# Patient Record
Sex: Female | Born: 1955 | Race: White | Hispanic: No | Marital: Married | Smoking: Current every day smoker
Health system: Southern US, Community
[De-identification: ages and names within clinical notes are randomized; demographics above are authoritative.]

## PROBLEM LIST (undated history)

## (undated) DIAGNOSIS — G609 Hereditary and idiopathic neuropathy, unspecified: Secondary | ICD-10-CM

## (undated) DIAGNOSIS — G43909 Migraine, unspecified, not intractable, without status migrainosus: Secondary | ICD-10-CM

## (undated) DIAGNOSIS — R609 Edema, unspecified: Secondary | ICD-10-CM

## (undated) DIAGNOSIS — I10 Essential (primary) hypertension: Secondary | ICD-10-CM

## (undated) DIAGNOSIS — R32 Unspecified urinary incontinence: Secondary | ICD-10-CM

## (undated) DIAGNOSIS — N390 Urinary tract infection, site not specified: Secondary | ICD-10-CM

## (undated) DIAGNOSIS — G4730 Sleep apnea, unspecified: Secondary | ICD-10-CM

## (undated) DIAGNOSIS — F329 Major depressive disorder, single episode, unspecified: Secondary | ICD-10-CM

## (undated) DIAGNOSIS — IMO0001 Reserved for inherently not codable concepts without codable children: Secondary | ICD-10-CM

## (undated) DIAGNOSIS — K279 Peptic ulcer, site unspecified, unspecified as acute or chronic, without hemorrhage or perforation: Secondary | ICD-10-CM

## (undated) DIAGNOSIS — L909 Atrophic disorder of skin, unspecified: Secondary | ICD-10-CM

## (undated) DIAGNOSIS — K7460 Unspecified cirrhosis of liver: Secondary | ICD-10-CM

## (undated) DIAGNOSIS — G2581 Restless legs syndrome: Secondary | ICD-10-CM

## (undated) DIAGNOSIS — E785 Hyperlipidemia, unspecified: Secondary | ICD-10-CM

## (undated) DIAGNOSIS — B354 Tinea corporis: Secondary | ICD-10-CM

## (undated) DIAGNOSIS — M5137 Other intervertebral disc degeneration, lumbosacral region: Secondary | ICD-10-CM

## (undated) DIAGNOSIS — L919 Hypertrophic disorder of the skin, unspecified: Secondary | ICD-10-CM

## (undated) DIAGNOSIS — J40 Bronchitis, not specified as acute or chronic: Secondary | ICD-10-CM

## (undated) DIAGNOSIS — IMO0002 Reserved for concepts with insufficient information to code with codable children: Secondary | ICD-10-CM

## (undated) DIAGNOSIS — G894 Chronic pain syndrome: Secondary | ICD-10-CM

## (undated) DIAGNOSIS — F3289 Other specified depressive episodes: Secondary | ICD-10-CM

## (undated) DIAGNOSIS — I671 Cerebral aneurysm, nonruptured: Secondary | ICD-10-CM

## (undated) DIAGNOSIS — G4700 Insomnia, unspecified: Secondary | ICD-10-CM

## (undated) DIAGNOSIS — M1990 Unspecified osteoarthritis, unspecified site: Secondary | ICD-10-CM

## (undated) DIAGNOSIS — G5600 Carpal tunnel syndrome, unspecified upper limb: Secondary | ICD-10-CM

## (undated) DIAGNOSIS — F17200 Nicotine dependence, unspecified, uncomplicated: Secondary | ICD-10-CM

## (undated) DIAGNOSIS — J309 Allergic rhinitis, unspecified: Secondary | ICD-10-CM

## (undated) DIAGNOSIS — M51379 Other intervertebral disc degeneration, lumbosacral region without mention of lumbar back pain or lower extremity pain: Secondary | ICD-10-CM

## (undated) DIAGNOSIS — E119 Type 2 diabetes mellitus without complications: Secondary | ICD-10-CM

## (undated) DIAGNOSIS — M129 Arthropathy, unspecified: Secondary | ICD-10-CM

## (undated) DIAGNOSIS — E669 Obesity, unspecified: Secondary | ICD-10-CM

## (undated) DIAGNOSIS — J45909 Unspecified asthma, uncomplicated: Secondary | ICD-10-CM

## (undated) DIAGNOSIS — K219 Gastro-esophageal reflux disease without esophagitis: Secondary | ICD-10-CM

## (undated) DIAGNOSIS — D649 Anemia, unspecified: Secondary | ICD-10-CM

## (undated) DIAGNOSIS — K635 Polyp of colon: Secondary | ICD-10-CM

## (undated) DIAGNOSIS — F411 Generalized anxiety disorder: Secondary | ICD-10-CM

## (undated) HISTORY — DX: Anemia, unspecified: D64.9

## (undated) HISTORY — DX: Nicotine dependence, unspecified, uncomplicated: F17.200

## (undated) HISTORY — DX: Chronic pain syndrome: G89.4

## (undated) HISTORY — DX: Polyp of colon: K63.5

## (undated) HISTORY — DX: Other specified depressive episodes: F32.89

## (undated) HISTORY — DX: Peptic ulcer, site unspecified, unspecified as acute or chronic, without hemorrhage or perforation: K27.9

## (undated) HISTORY — DX: Insomnia, unspecified: G47.00

## (undated) HISTORY — DX: Edema, unspecified: R60.9

## (undated) HISTORY — PX: TONSILLECTOMY: SUR1361

## (undated) HISTORY — DX: Essential (primary) hypertension: I10

## (undated) HISTORY — DX: Atrophic disorder of skin, unspecified: L90.9

## (undated) HISTORY — DX: Unspecified osteoarthritis, unspecified site: M19.90

## (undated) HISTORY — DX: Cerebral aneurysm, nonruptured: I67.1

## (undated) HISTORY — DX: Urinary tract infection, site not specified: N39.0

## (undated) HISTORY — DX: Bronchitis, not specified as acute or chronic: J40

## (undated) HISTORY — DX: Carpal tunnel syndrome, unspecified upper limb: G56.00

## (undated) HISTORY — DX: Sleep apnea, unspecified: G47.30

## (undated) HISTORY — DX: Type 2 diabetes mellitus without complications: E11.9

## (undated) HISTORY — DX: Unspecified urinary incontinence: R32

## (undated) HISTORY — DX: Obesity, unspecified: E66.9

## (undated) HISTORY — PX: CLAVICLE SURGERY: SHX598

## (undated) HISTORY — PX: PARTIAL HYSTERECTOMY: SHX80

## (undated) HISTORY — DX: Unspecified cirrhosis of liver: K74.60

## (undated) HISTORY — DX: Major depressive disorder, single episode, unspecified: F32.9

## (undated) HISTORY — DX: Other intervertebral disc degeneration, lumbosacral region: M51.37

## (undated) HISTORY — PX: ABDOMINAL HYSTERECTOMY: SHX81

## (undated) HISTORY — DX: Restless legs syndrome: G25.81

## (undated) HISTORY — DX: Migraine, unspecified, not intractable, without status migrainosus: G43.909

## (undated) HISTORY — PX: TOTAL KNEE ARTHROPLASTY: SHX125

## (undated) HISTORY — DX: Unspecified asthma, uncomplicated: J45.909

## (undated) HISTORY — DX: Arthropathy, unspecified: M12.9

## (undated) HISTORY — DX: Hereditary and idiopathic neuropathy, unspecified: G60.9

## (undated) HISTORY — DX: Gastro-esophageal reflux disease without esophagitis: K21.9

## (undated) HISTORY — DX: Tinea corporis: B35.4

## (undated) HISTORY — PX: CARPAL TUNNEL RELEASE: SHX101

## (undated) HISTORY — PX: COLONOSCOPY: SHX174

## (undated) HISTORY — DX: Reserved for concepts with insufficient information to code with codable children: IMO0002

## (undated) HISTORY — PX: BRAIN SURGERY: SHX531

## (undated) HISTORY — DX: Other intervertebral disc degeneration, lumbosacral region without mention of lumbar back pain or lower extremity pain: M51.379

## (undated) HISTORY — DX: Generalized anxiety disorder: F41.1

## (undated) HISTORY — DX: Reserved for inherently not codable concepts without codable children: IMO0001

## (undated) HISTORY — DX: Allergic rhinitis, unspecified: J30.9

## (undated) HISTORY — DX: Hyperlipidemia, unspecified: E78.5

## (undated) HISTORY — DX: Hypertrophic disorder of the skin, unspecified: L91.9

---

## 1982-06-10 HISTORY — PX: OTHER SURGICAL HISTORY: SHX169

## 1991-06-11 HISTORY — PX: OTHER SURGICAL HISTORY: SHX169

## 2003-06-11 ENCOUNTER — Encounter (INDEPENDENT_AMBULATORY_CARE_PROVIDER_SITE_OTHER): Payer: Self-pay | Admitting: Family Medicine

## 2005-10-17 ENCOUNTER — Ambulatory Visit: Payer: Self-pay | Admitting: Family Medicine

## 2005-11-05 ENCOUNTER — Ambulatory Visit: Payer: Self-pay | Admitting: Family Medicine

## 2005-11-14 ENCOUNTER — Ambulatory Visit: Payer: Self-pay | Admitting: Family Medicine

## 2005-12-04 ENCOUNTER — Encounter (INDEPENDENT_AMBULATORY_CARE_PROVIDER_SITE_OTHER): Payer: Self-pay | Admitting: Family Medicine

## 2005-12-12 ENCOUNTER — Ambulatory Visit: Payer: Self-pay | Admitting: Family Medicine

## 2005-12-17 ENCOUNTER — Ambulatory Visit: Payer: Self-pay | Admitting: Family Medicine

## 2005-12-18 ENCOUNTER — Encounter (INDEPENDENT_AMBULATORY_CARE_PROVIDER_SITE_OTHER): Payer: Self-pay | Admitting: Family Medicine

## 2005-12-26 ENCOUNTER — Ambulatory Visit: Payer: Self-pay | Admitting: Family Medicine

## 2006-01-10 ENCOUNTER — Emergency Department (HOSPITAL_COMMUNITY): Admission: EM | Admit: 2006-01-10 | Discharge: 2006-01-10 | Payer: Self-pay | Admitting: Emergency Medicine

## 2006-01-13 ENCOUNTER — Ambulatory Visit: Payer: Self-pay | Admitting: Family Medicine

## 2006-01-17 ENCOUNTER — Ambulatory Visit: Payer: Self-pay | Admitting: Family Medicine

## 2006-01-21 ENCOUNTER — Ambulatory Visit (HOSPITAL_COMMUNITY): Payer: Self-pay | Admitting: Psychology

## 2006-01-31 ENCOUNTER — Ambulatory Visit: Payer: Self-pay | Admitting: Family Medicine

## 2006-02-04 ENCOUNTER — Ambulatory Visit (HOSPITAL_COMMUNITY): Payer: Self-pay | Admitting: Psychology

## 2006-02-04 ENCOUNTER — Encounter (INDEPENDENT_AMBULATORY_CARE_PROVIDER_SITE_OTHER): Payer: Self-pay | Admitting: Family Medicine

## 2006-02-04 LAB — CONVERTED CEMR LAB: WBC, blood: 7.2 10*3/uL

## 2006-02-13 ENCOUNTER — Ambulatory Visit: Payer: Self-pay | Admitting: Family Medicine

## 2006-02-17 ENCOUNTER — Ambulatory Visit: Payer: Self-pay | Admitting: Family Medicine

## 2006-02-18 ENCOUNTER — Encounter (INDEPENDENT_AMBULATORY_CARE_PROVIDER_SITE_OTHER): Payer: Self-pay | Admitting: Family Medicine

## 2006-02-18 LAB — CONVERTED CEMR LAB
RBC count: 4.55 10*6/uL
WBC, blood: 6.3 10*3/uL

## 2006-02-28 ENCOUNTER — Ambulatory Visit: Payer: Self-pay | Admitting: Family Medicine

## 2006-03-08 ENCOUNTER — Encounter (INDEPENDENT_AMBULATORY_CARE_PROVIDER_SITE_OTHER): Payer: Self-pay | Admitting: Family Medicine

## 2006-03-28 ENCOUNTER — Ambulatory Visit: Payer: Self-pay | Admitting: Family Medicine

## 2006-05-08 ENCOUNTER — Ambulatory Visit: Payer: Self-pay | Admitting: Family Medicine

## 2006-05-13 ENCOUNTER — Encounter: Payer: Self-pay | Admitting: Family Medicine

## 2006-05-13 DIAGNOSIS — K219 Gastro-esophageal reflux disease without esophagitis: Secondary | ICD-10-CM

## 2006-05-13 DIAGNOSIS — E785 Hyperlipidemia, unspecified: Secondary | ICD-10-CM | POA: Insufficient documentation

## 2006-05-13 DIAGNOSIS — J309 Allergic rhinitis, unspecified: Secondary | ICD-10-CM | POA: Insufficient documentation

## 2006-05-13 DIAGNOSIS — K279 Peptic ulcer, site unspecified, unspecified as acute or chronic, without hemorrhage or perforation: Secondary | ICD-10-CM | POA: Insufficient documentation

## 2006-05-13 DIAGNOSIS — F329 Major depressive disorder, single episode, unspecified: Secondary | ICD-10-CM | POA: Insufficient documentation

## 2006-05-13 DIAGNOSIS — N390 Urinary tract infection, site not specified: Secondary | ICD-10-CM

## 2006-05-13 DIAGNOSIS — G609 Hereditary and idiopathic neuropathy, unspecified: Secondary | ICD-10-CM | POA: Insufficient documentation

## 2006-05-13 DIAGNOSIS — J45909 Unspecified asthma, uncomplicated: Secondary | ICD-10-CM | POA: Insufficient documentation

## 2006-05-13 DIAGNOSIS — G2581 Restless legs syndrome: Secondary | ICD-10-CM | POA: Insufficient documentation

## 2006-05-13 DIAGNOSIS — R32 Unspecified urinary incontinence: Secondary | ICD-10-CM | POA: Insufficient documentation

## 2006-05-13 DIAGNOSIS — I10 Essential (primary) hypertension: Secondary | ICD-10-CM | POA: Insufficient documentation

## 2006-05-13 DIAGNOSIS — G43909 Migraine, unspecified, not intractable, without status migrainosus: Secondary | ICD-10-CM | POA: Insufficient documentation

## 2006-05-13 DIAGNOSIS — F411 Generalized anxiety disorder: Secondary | ICD-10-CM | POA: Insufficient documentation

## 2006-05-13 DIAGNOSIS — M1990 Unspecified osteoarthritis, unspecified site: Secondary | ICD-10-CM | POA: Insufficient documentation

## 2006-05-13 DIAGNOSIS — I671 Cerebral aneurysm, nonruptured: Secondary | ICD-10-CM

## 2006-05-13 DIAGNOSIS — IMO0001 Reserved for inherently not codable concepts without codable children: Secondary | ICD-10-CM | POA: Insufficient documentation

## 2006-06-05 ENCOUNTER — Encounter: Payer: Self-pay | Admitting: Internal Medicine

## 2006-06-05 ENCOUNTER — Other Ambulatory Visit: Admission: RE | Admit: 2006-06-05 | Discharge: 2006-06-05 | Payer: Self-pay | Admitting: Family Medicine

## 2006-06-05 ENCOUNTER — Encounter (INDEPENDENT_AMBULATORY_CARE_PROVIDER_SITE_OTHER): Payer: Self-pay | Admitting: Family Medicine

## 2006-06-05 ENCOUNTER — Ambulatory Visit: Payer: Self-pay | Admitting: Family Medicine

## 2006-06-10 ENCOUNTER — Encounter (INDEPENDENT_AMBULATORY_CARE_PROVIDER_SITE_OTHER): Payer: Self-pay | Admitting: Family Medicine

## 2006-06-10 HISTORY — PX: OTHER SURGICAL HISTORY: SHX169

## 2006-06-13 ENCOUNTER — Ambulatory Visit: Payer: Self-pay | Admitting: Family Medicine

## 2006-06-13 LAB — CONVERTED CEMR LAB
ALT: 20 units/L (ref 0–35)
Alkaline Phosphatase: 93 units/L (ref 39–117)
Basophils Absolute: 0.1 10*3/uL (ref 0.0–0.1)
Basophils Relative: 1 % (ref 0–1)
CO2: 19 meq/L (ref 19–32)
Cholesterol: 223 mg/dL — ABNORMAL HIGH (ref 0–200)
Creatinine, Ser: 0.7 mg/dL (ref 0.40–1.20)
Eosinophils Relative: 5 % (ref 0–5)
HCT: 43.3 % (ref 36.0–46.0)
Hemoglobin: 13.9 g/dL (ref 12.0–15.0)
Hepatitis B Surface Ag: NEGATIVE
LDL Cholesterol: 136 mg/dL — ABNORMAL HIGH (ref 0–99)
Lymphocytes Relative: 34 % (ref 12–46)
MCHC: 32.1 g/dL (ref 30.0–36.0)
MCV: 89.6 fL (ref 78.0–100.0)
Monocytes Absolute: 0.5 10*3/uL (ref 0.2–0.7)
Platelets: 106 10*3/uL — ABNORMAL LOW (ref 150–400)
RDW: 14.4 % — ABNORMAL HIGH (ref 11.5–14.0)
Sed Rate: 9 mm/hr (ref 0–22)
Total Bilirubin: 0.5 mg/dL (ref 0.3–1.2)
Total CHOL/HDL Ratio: 4.5
VLDL: 37 mg/dL (ref 0–40)

## 2006-06-23 ENCOUNTER — Encounter: Payer: Self-pay | Admitting: Internal Medicine

## 2006-06-23 ENCOUNTER — Ambulatory Visit: Payer: Self-pay | Admitting: Family Medicine

## 2006-07-03 ENCOUNTER — Ambulatory Visit: Payer: Self-pay | Admitting: Family Medicine

## 2006-07-07 ENCOUNTER — Encounter (INDEPENDENT_AMBULATORY_CARE_PROVIDER_SITE_OTHER): Payer: Self-pay | Admitting: Family Medicine

## 2006-07-07 ENCOUNTER — Ambulatory Visit (HOSPITAL_COMMUNITY): Admission: RE | Admit: 2006-07-07 | Discharge: 2006-07-07 | Payer: Self-pay | Admitting: Family Medicine

## 2006-07-08 ENCOUNTER — Encounter (INDEPENDENT_AMBULATORY_CARE_PROVIDER_SITE_OTHER): Payer: Self-pay | Admitting: Family Medicine

## 2006-07-11 ENCOUNTER — Encounter: Payer: Self-pay | Admitting: Internal Medicine

## 2006-08-04 ENCOUNTER — Ambulatory Visit: Payer: Self-pay | Admitting: Family Medicine

## 2006-08-04 LAB — CONVERTED CEMR LAB
Cholesterol, target level: 200 mg/dL
LDL Goal: 100 mg/dL

## 2006-08-06 ENCOUNTER — Ambulatory Visit: Payer: Self-pay | Admitting: Family Medicine

## 2006-08-13 ENCOUNTER — Encounter: Payer: Self-pay | Admitting: Internal Medicine

## 2006-08-14 ENCOUNTER — Encounter (INDEPENDENT_AMBULATORY_CARE_PROVIDER_SITE_OTHER): Payer: Self-pay | Admitting: Family Medicine

## 2006-08-18 ENCOUNTER — Encounter (INDEPENDENT_AMBULATORY_CARE_PROVIDER_SITE_OTHER): Payer: Self-pay | Admitting: Family Medicine

## 2006-08-27 ENCOUNTER — Telehealth (INDEPENDENT_AMBULATORY_CARE_PROVIDER_SITE_OTHER): Payer: Self-pay | Admitting: Family Medicine

## 2006-09-02 ENCOUNTER — Ambulatory Visit: Payer: Self-pay | Admitting: Family Medicine

## 2006-09-02 LAB — CONVERTED CEMR LAB
Bilirubin Urine: NEGATIVE
Blood in Urine, dipstick: NEGATIVE
Glucose, Bld: 183 mg/dL
Specific Gravity, Urine: 1.02
Urobilinogen, UA: 0.2
WBC Urine, dipstick: NEGATIVE

## 2006-09-09 ENCOUNTER — Ambulatory Visit: Payer: Self-pay | Admitting: Family Medicine

## 2006-09-11 ENCOUNTER — Encounter (INDEPENDENT_AMBULATORY_CARE_PROVIDER_SITE_OTHER): Payer: Self-pay | Admitting: Family Medicine

## 2006-09-12 ENCOUNTER — Telehealth (INDEPENDENT_AMBULATORY_CARE_PROVIDER_SITE_OTHER): Payer: Self-pay | Admitting: Family Medicine

## 2006-09-12 ENCOUNTER — Encounter (INDEPENDENT_AMBULATORY_CARE_PROVIDER_SITE_OTHER): Payer: Self-pay | Admitting: Family Medicine

## 2006-09-15 ENCOUNTER — Telehealth (INDEPENDENT_AMBULATORY_CARE_PROVIDER_SITE_OTHER): Payer: Self-pay | Admitting: Family Medicine

## 2006-09-19 ENCOUNTER — Ambulatory Visit: Payer: Self-pay | Admitting: Gastroenterology

## 2006-09-19 ENCOUNTER — Encounter: Payer: Self-pay | Admitting: Internal Medicine

## 2006-09-19 ENCOUNTER — Encounter (INDEPENDENT_AMBULATORY_CARE_PROVIDER_SITE_OTHER): Payer: Self-pay | Admitting: Specialist

## 2006-09-19 ENCOUNTER — Ambulatory Visit (HOSPITAL_COMMUNITY): Admission: RE | Admit: 2006-09-19 | Discharge: 2006-09-19 | Payer: Self-pay | Admitting: Gastroenterology

## 2006-09-19 ENCOUNTER — Encounter (INDEPENDENT_AMBULATORY_CARE_PROVIDER_SITE_OTHER): Payer: Self-pay | Admitting: Family Medicine

## 2006-09-22 ENCOUNTER — Ambulatory Visit: Payer: Self-pay | Admitting: Family Medicine

## 2006-09-23 ENCOUNTER — Telehealth (INDEPENDENT_AMBULATORY_CARE_PROVIDER_SITE_OTHER): Payer: Self-pay | Admitting: *Deleted

## 2006-10-09 ENCOUNTER — Encounter (INDEPENDENT_AMBULATORY_CARE_PROVIDER_SITE_OTHER): Payer: Self-pay | Admitting: Family Medicine

## 2006-10-20 ENCOUNTER — Ambulatory Visit: Payer: Self-pay | Admitting: Family Medicine

## 2006-10-21 ENCOUNTER — Encounter (INDEPENDENT_AMBULATORY_CARE_PROVIDER_SITE_OTHER): Payer: Self-pay | Admitting: Family Medicine

## 2006-10-22 ENCOUNTER — Encounter (INDEPENDENT_AMBULATORY_CARE_PROVIDER_SITE_OTHER): Payer: Self-pay | Admitting: Family Medicine

## 2006-10-23 ENCOUNTER — Telehealth (INDEPENDENT_AMBULATORY_CARE_PROVIDER_SITE_OTHER): Payer: Self-pay | Admitting: *Deleted

## 2006-10-24 ENCOUNTER — Ambulatory Visit: Payer: Self-pay | Admitting: Family Medicine

## 2006-10-27 ENCOUNTER — Ambulatory Visit: Payer: Self-pay | Admitting: Family Medicine

## 2006-10-27 ENCOUNTER — Telehealth (INDEPENDENT_AMBULATORY_CARE_PROVIDER_SITE_OTHER): Payer: Self-pay | Admitting: Family Medicine

## 2006-10-29 ENCOUNTER — Encounter (INDEPENDENT_AMBULATORY_CARE_PROVIDER_SITE_OTHER): Payer: Self-pay | Admitting: Family Medicine

## 2006-11-13 ENCOUNTER — Telehealth (INDEPENDENT_AMBULATORY_CARE_PROVIDER_SITE_OTHER): Payer: Self-pay | Admitting: Family Medicine

## 2006-11-14 ENCOUNTER — Telehealth (INDEPENDENT_AMBULATORY_CARE_PROVIDER_SITE_OTHER): Payer: Self-pay | Admitting: *Deleted

## 2006-11-20 ENCOUNTER — Encounter (INDEPENDENT_AMBULATORY_CARE_PROVIDER_SITE_OTHER): Payer: Self-pay | Admitting: Family Medicine

## 2006-12-01 ENCOUNTER — Encounter (INDEPENDENT_AMBULATORY_CARE_PROVIDER_SITE_OTHER): Payer: Self-pay | Admitting: Family Medicine

## 2006-12-02 ENCOUNTER — Encounter (INDEPENDENT_AMBULATORY_CARE_PROVIDER_SITE_OTHER): Payer: Self-pay | Admitting: Family Medicine

## 2006-12-02 LAB — CONVERTED CEMR LAB
AST: 24 units/L (ref 0–37)
Alkaline Phosphatase: 101 units/L (ref 39–117)
BUN: 16 mg/dL (ref 6–23)
Creatinine, Ser: 0.83 mg/dL (ref 0.40–1.20)
HDL: 42 mg/dL (ref >39)
LDL Cholesterol: 103 mg/dL — ABNORMAL HIGH (ref 0–99)
Total CHOL/HDL Ratio: 4.5
VLDL: 45 mg/dL — ABNORMAL HIGH (ref 0–40)

## 2006-12-03 ENCOUNTER — Ambulatory Visit: Payer: Self-pay | Admitting: Family Medicine

## 2006-12-03 LAB — CONVERTED CEMR LAB: Glucose, Bld: 118 mg/dL

## 2006-12-05 ENCOUNTER — Encounter (INDEPENDENT_AMBULATORY_CARE_PROVIDER_SITE_OTHER): Payer: Self-pay | Admitting: Family Medicine

## 2006-12-08 ENCOUNTER — Telehealth (INDEPENDENT_AMBULATORY_CARE_PROVIDER_SITE_OTHER): Payer: Self-pay | Admitting: *Deleted

## 2006-12-09 ENCOUNTER — Telehealth (INDEPENDENT_AMBULATORY_CARE_PROVIDER_SITE_OTHER): Payer: Self-pay | Admitting: Family Medicine

## 2006-12-09 ENCOUNTER — Telehealth (INDEPENDENT_AMBULATORY_CARE_PROVIDER_SITE_OTHER): Payer: Self-pay | Admitting: *Deleted

## 2006-12-17 ENCOUNTER — Telehealth (INDEPENDENT_AMBULATORY_CARE_PROVIDER_SITE_OTHER): Payer: Self-pay | Admitting: Family Medicine

## 2006-12-26 ENCOUNTER — Encounter (INDEPENDENT_AMBULATORY_CARE_PROVIDER_SITE_OTHER): Payer: Self-pay | Admitting: Family Medicine

## 2007-01-01 ENCOUNTER — Encounter (INDEPENDENT_AMBULATORY_CARE_PROVIDER_SITE_OTHER): Payer: Self-pay | Admitting: Family Medicine

## 2007-01-06 ENCOUNTER — Telehealth (INDEPENDENT_AMBULATORY_CARE_PROVIDER_SITE_OTHER): Payer: Self-pay | Admitting: Family Medicine

## 2007-01-07 ENCOUNTER — Encounter (INDEPENDENT_AMBULATORY_CARE_PROVIDER_SITE_OTHER): Payer: Self-pay | Admitting: Family Medicine

## 2007-01-20 ENCOUNTER — Ambulatory Visit: Payer: Self-pay | Admitting: Family Medicine

## 2007-01-23 ENCOUNTER — Encounter (INDEPENDENT_AMBULATORY_CARE_PROVIDER_SITE_OTHER): Payer: Self-pay | Admitting: Family Medicine

## 2007-01-27 ENCOUNTER — Encounter (INDEPENDENT_AMBULATORY_CARE_PROVIDER_SITE_OTHER): Payer: Self-pay | Admitting: Family Medicine

## 2007-02-01 ENCOUNTER — Encounter (INDEPENDENT_AMBULATORY_CARE_PROVIDER_SITE_OTHER): Payer: Self-pay | Admitting: Family Medicine

## 2007-02-01 ENCOUNTER — Ambulatory Visit (HOSPITAL_BASED_OUTPATIENT_CLINIC_OR_DEPARTMENT_OTHER): Admission: RE | Admit: 2007-02-01 | Discharge: 2007-02-01 | Payer: Self-pay | Admitting: Family Medicine

## 2007-02-04 ENCOUNTER — Ambulatory Visit: Payer: Self-pay | Admitting: Family Medicine

## 2007-02-05 ENCOUNTER — Encounter (INDEPENDENT_AMBULATORY_CARE_PROVIDER_SITE_OTHER): Payer: Self-pay | Admitting: Family Medicine

## 2007-02-07 ENCOUNTER — Ambulatory Visit: Payer: Self-pay | Admitting: Internal Medicine

## 2007-02-10 ENCOUNTER — Ambulatory Visit (HOSPITAL_COMMUNITY): Admission: RE | Admit: 2007-02-10 | Discharge: 2007-02-10 | Payer: Self-pay | Admitting: *Deleted

## 2007-02-16 ENCOUNTER — Telehealth (INDEPENDENT_AMBULATORY_CARE_PROVIDER_SITE_OTHER): Payer: Self-pay | Admitting: Family Medicine

## 2007-02-17 ENCOUNTER — Encounter (INDEPENDENT_AMBULATORY_CARE_PROVIDER_SITE_OTHER): Payer: Self-pay | Admitting: Family Medicine

## 2007-02-20 ENCOUNTER — Telehealth (INDEPENDENT_AMBULATORY_CARE_PROVIDER_SITE_OTHER): Payer: Self-pay | Admitting: Family Medicine

## 2007-02-20 ENCOUNTER — Ambulatory Visit: Payer: Self-pay | Admitting: Family Medicine

## 2007-02-23 ENCOUNTER — Encounter (INDEPENDENT_AMBULATORY_CARE_PROVIDER_SITE_OTHER): Payer: Self-pay | Admitting: Family Medicine

## 2007-02-23 ENCOUNTER — Telehealth (INDEPENDENT_AMBULATORY_CARE_PROVIDER_SITE_OTHER): Payer: Self-pay | Admitting: *Deleted

## 2007-02-26 ENCOUNTER — Encounter (INDEPENDENT_AMBULATORY_CARE_PROVIDER_SITE_OTHER): Payer: Self-pay | Admitting: Family Medicine

## 2007-03-02 ENCOUNTER — Ambulatory Visit: Payer: Self-pay | Admitting: Pulmonary Disease

## 2007-03-02 ENCOUNTER — Encounter (INDEPENDENT_AMBULATORY_CARE_PROVIDER_SITE_OTHER): Payer: Self-pay | Admitting: Family Medicine

## 2007-03-04 ENCOUNTER — Encounter (INDEPENDENT_AMBULATORY_CARE_PROVIDER_SITE_OTHER): Payer: Self-pay | Admitting: Family Medicine

## 2007-03-04 ENCOUNTER — Telehealth (INDEPENDENT_AMBULATORY_CARE_PROVIDER_SITE_OTHER): Payer: Self-pay | Admitting: *Deleted

## 2007-03-12 ENCOUNTER — Ambulatory Visit: Payer: Self-pay | Admitting: Family Medicine

## 2007-03-12 DIAGNOSIS — E119 Type 2 diabetes mellitus without complications: Secondary | ICD-10-CM

## 2007-03-16 ENCOUNTER — Encounter (INDEPENDENT_AMBULATORY_CARE_PROVIDER_SITE_OTHER): Payer: Self-pay | Admitting: Family Medicine

## 2007-03-19 ENCOUNTER — Encounter (INDEPENDENT_AMBULATORY_CARE_PROVIDER_SITE_OTHER): Payer: Self-pay | Admitting: Family Medicine

## 2007-03-23 ENCOUNTER — Telehealth (INDEPENDENT_AMBULATORY_CARE_PROVIDER_SITE_OTHER): Payer: Self-pay | Admitting: *Deleted

## 2007-04-07 ENCOUNTER — Ambulatory Visit: Payer: Self-pay | Admitting: Family Medicine

## 2007-04-09 ENCOUNTER — Telehealth (INDEPENDENT_AMBULATORY_CARE_PROVIDER_SITE_OTHER): Payer: Self-pay | Admitting: *Deleted

## 2007-04-09 ENCOUNTER — Encounter (INDEPENDENT_AMBULATORY_CARE_PROVIDER_SITE_OTHER): Payer: Self-pay | Admitting: Family Medicine

## 2007-04-10 LAB — CONVERTED CEMR LAB
ALT: 23 units/L (ref 0–35)
AST: 23 units/L (ref 0–37)
Anti Nuclear Antibody(ANA): NEGATIVE
Calcium: 9.4 mg/dL (ref 8.4–10.5)
Chloride: 109 meq/L (ref 96–112)
Creatinine, Ser: 0.96 mg/dL (ref 0.40–1.20)
Creatinine, Urine: 80.9 mg/dL
Microalb Creat Ratio: 3.7 mg/g (ref 0.0–30.0)
Potassium: 3.8 meq/L (ref 3.5–5.3)
Total CHOL/HDL Ratio: 3.1

## 2007-04-13 ENCOUNTER — Telehealth (INDEPENDENT_AMBULATORY_CARE_PROVIDER_SITE_OTHER): Payer: Self-pay | Admitting: *Deleted

## 2007-04-14 ENCOUNTER — Ambulatory Visit: Payer: Self-pay | Admitting: Physical Medicine & Rehabilitation

## 2007-04-14 ENCOUNTER — Encounter
Admission: RE | Admit: 2007-04-14 | Discharge: 2007-07-13 | Payer: Self-pay | Admitting: Physical Medicine & Rehabilitation

## 2007-04-15 ENCOUNTER — Encounter (INDEPENDENT_AMBULATORY_CARE_PROVIDER_SITE_OTHER): Payer: Self-pay | Admitting: Family Medicine

## 2007-04-22 ENCOUNTER — Encounter (INDEPENDENT_AMBULATORY_CARE_PROVIDER_SITE_OTHER): Payer: Self-pay | Admitting: Family Medicine

## 2007-04-23 ENCOUNTER — Encounter (INDEPENDENT_AMBULATORY_CARE_PROVIDER_SITE_OTHER): Payer: Self-pay | Admitting: Family Medicine

## 2007-04-27 ENCOUNTER — Ambulatory Visit: Payer: Self-pay | Admitting: Family Medicine

## 2007-04-28 ENCOUNTER — Telehealth (INDEPENDENT_AMBULATORY_CARE_PROVIDER_SITE_OTHER): Payer: Self-pay | Admitting: Family Medicine

## 2007-05-01 ENCOUNTER — Encounter (INDEPENDENT_AMBULATORY_CARE_PROVIDER_SITE_OTHER): Payer: Self-pay | Admitting: Family Medicine

## 2007-05-05 ENCOUNTER — Encounter
Admission: RE | Admit: 2007-05-05 | Discharge: 2007-05-05 | Payer: Self-pay | Admitting: Physical Medicine & Rehabilitation

## 2007-05-18 ENCOUNTER — Ambulatory Visit: Payer: Self-pay | Admitting: Physical Medicine & Rehabilitation

## 2007-06-01 ENCOUNTER — Ambulatory Visit: Payer: Self-pay | Admitting: Physical Medicine & Rehabilitation

## 2007-06-15 ENCOUNTER — Ambulatory Visit: Payer: Self-pay | Admitting: Family Medicine

## 2007-06-15 ENCOUNTER — Encounter
Admission: RE | Admit: 2007-06-15 | Discharge: 2007-08-20 | Payer: Self-pay | Admitting: Physical Medicine & Rehabilitation

## 2007-06-15 LAB — CONVERTED CEMR LAB: Blood Glucose, Fasting: 130 mg/dL

## 2007-06-16 ENCOUNTER — Telehealth (INDEPENDENT_AMBULATORY_CARE_PROVIDER_SITE_OTHER): Payer: Self-pay | Admitting: *Deleted

## 2007-06-16 LAB — CONVERTED CEMR LAB
BUN: 13 mg/dL (ref 6–23)
Calcium: 9.8 mg/dL (ref 8.4–10.5)
Creatinine, Ser: 0.89 mg/dL (ref 0.40–1.20)
Potassium: 3.9 meq/L (ref 3.5–5.3)

## 2007-06-18 ENCOUNTER — Encounter (INDEPENDENT_AMBULATORY_CARE_PROVIDER_SITE_OTHER): Payer: Self-pay | Admitting: Family Medicine

## 2007-06-18 ENCOUNTER — Telehealth (INDEPENDENT_AMBULATORY_CARE_PROVIDER_SITE_OTHER): Payer: Self-pay | Admitting: Family Medicine

## 2007-06-25 ENCOUNTER — Encounter (INDEPENDENT_AMBULATORY_CARE_PROVIDER_SITE_OTHER): Payer: Self-pay | Admitting: Family Medicine

## 2007-06-29 ENCOUNTER — Encounter
Admission: RE | Admit: 2007-06-29 | Discharge: 2007-09-27 | Payer: Self-pay | Admitting: Physical Medicine & Rehabilitation

## 2007-07-01 ENCOUNTER — Encounter (INDEPENDENT_AMBULATORY_CARE_PROVIDER_SITE_OTHER): Payer: Self-pay | Admitting: Family Medicine

## 2007-07-24 ENCOUNTER — Ambulatory Visit: Payer: Self-pay | Admitting: Physical Medicine & Rehabilitation

## 2007-07-28 ENCOUNTER — Ambulatory Visit: Payer: Self-pay | Admitting: Family Medicine

## 2007-08-11 ENCOUNTER — Encounter (INDEPENDENT_AMBULATORY_CARE_PROVIDER_SITE_OTHER): Payer: Self-pay | Admitting: Family Medicine

## 2007-08-12 ENCOUNTER — Ambulatory Visit: Payer: Self-pay | Admitting: Family Medicine

## 2007-08-12 LAB — CONVERTED CEMR LAB
Bilirubin Urine: NEGATIVE
Glucose, Urine, Semiquant: NEGATIVE
Protein, U semiquant: NEGATIVE
Specific Gravity, Urine: <1.005
pH: 6.5

## 2007-08-18 ENCOUNTER — Encounter (INDEPENDENT_AMBULATORY_CARE_PROVIDER_SITE_OTHER): Payer: Self-pay | Admitting: Family Medicine

## 2007-08-24 ENCOUNTER — Encounter
Admission: RE | Admit: 2007-08-24 | Discharge: 2007-11-22 | Payer: Self-pay | Admitting: Physical Medicine & Rehabilitation

## 2007-08-24 ENCOUNTER — Ambulatory Visit: Payer: Self-pay | Admitting: Physical Medicine & Rehabilitation

## 2007-08-27 ENCOUNTER — Ambulatory Visit: Payer: Self-pay | Admitting: Family Medicine

## 2007-08-27 DIAGNOSIS — G894 Chronic pain syndrome: Secondary | ICD-10-CM | POA: Insufficient documentation

## 2007-08-31 ENCOUNTER — Encounter
Admission: RE | Admit: 2007-08-31 | Discharge: 2007-08-31 | Payer: Self-pay | Admitting: Physical Medicine & Rehabilitation

## 2007-09-10 ENCOUNTER — Ambulatory Visit: Payer: Self-pay | Admitting: Family Medicine

## 2007-09-10 LAB — CONVERTED CEMR LAB: Glucose, Bld: 107 mg/dL

## 2007-09-17 ENCOUNTER — Ambulatory Visit: Payer: Self-pay | Admitting: Physical Medicine & Rehabilitation

## 2007-09-23 ENCOUNTER — Telehealth (INDEPENDENT_AMBULATORY_CARE_PROVIDER_SITE_OTHER): Payer: Self-pay | Admitting: Family Medicine

## 2007-10-08 ENCOUNTER — Other Ambulatory Visit: Admission: RE | Admit: 2007-10-08 | Discharge: 2007-10-08 | Payer: Self-pay | Admitting: Internal Medicine

## 2007-10-08 ENCOUNTER — Telehealth (INDEPENDENT_AMBULATORY_CARE_PROVIDER_SITE_OTHER): Payer: Self-pay | Admitting: *Deleted

## 2007-10-08 ENCOUNTER — Ambulatory Visit: Payer: Self-pay | Admitting: Family Medicine

## 2007-10-08 ENCOUNTER — Encounter (INDEPENDENT_AMBULATORY_CARE_PROVIDER_SITE_OTHER): Payer: Self-pay | Admitting: Family Medicine

## 2007-10-08 DIAGNOSIS — B354 Tinea corporis: Secondary | ICD-10-CM | POA: Insufficient documentation

## 2007-10-12 ENCOUNTER — Ambulatory Visit: Payer: Self-pay | Admitting: Physical Medicine & Rehabilitation

## 2007-10-13 ENCOUNTER — Telehealth (INDEPENDENT_AMBULATORY_CARE_PROVIDER_SITE_OTHER): Payer: Self-pay | Admitting: *Deleted

## 2007-10-14 ENCOUNTER — Telehealth (INDEPENDENT_AMBULATORY_CARE_PROVIDER_SITE_OTHER): Payer: Self-pay | Admitting: Family Medicine

## 2007-10-15 ENCOUNTER — Telehealth (INDEPENDENT_AMBULATORY_CARE_PROVIDER_SITE_OTHER): Payer: Self-pay | Admitting: Family Medicine

## 2007-10-15 ENCOUNTER — Ambulatory Visit: Payer: Self-pay | Admitting: Family Medicine

## 2007-10-15 DIAGNOSIS — IMO0002 Reserved for concepts with insufficient information to code with codable children: Secondary | ICD-10-CM

## 2007-10-15 DIAGNOSIS — M5137 Other intervertebral disc degeneration, lumbosacral region: Secondary | ICD-10-CM

## 2007-10-15 DIAGNOSIS — M51379 Other intervertebral disc degeneration, lumbosacral region without mention of lumbar back pain or lower extremity pain: Secondary | ICD-10-CM | POA: Insufficient documentation

## 2007-10-19 ENCOUNTER — Encounter (INDEPENDENT_AMBULATORY_CARE_PROVIDER_SITE_OTHER): Payer: Self-pay | Admitting: Family Medicine

## 2007-10-23 ENCOUNTER — Telehealth (INDEPENDENT_AMBULATORY_CARE_PROVIDER_SITE_OTHER): Payer: Self-pay | Admitting: *Deleted

## 2007-10-28 ENCOUNTER — Telehealth (INDEPENDENT_AMBULATORY_CARE_PROVIDER_SITE_OTHER): Payer: Self-pay | Admitting: *Deleted

## 2007-11-10 ENCOUNTER — Ambulatory Visit: Payer: Self-pay | Admitting: Physical Medicine & Rehabilitation

## 2007-11-17 ENCOUNTER — Ambulatory Visit: Payer: Self-pay | Admitting: Family Medicine

## 2007-11-17 ENCOUNTER — Telehealth (INDEPENDENT_AMBULATORY_CARE_PROVIDER_SITE_OTHER): Payer: Self-pay | Admitting: Family Medicine

## 2007-12-07 ENCOUNTER — Encounter
Admission: RE | Admit: 2007-12-07 | Discharge: 2007-12-07 | Payer: Self-pay | Admitting: Physical Medicine & Rehabilitation

## 2007-12-09 ENCOUNTER — Ambulatory Visit: Payer: Self-pay | Admitting: Physical Medicine & Rehabilitation

## 2007-12-17 ENCOUNTER — Ambulatory Visit: Payer: Self-pay | Admitting: Family Medicine

## 2007-12-17 LAB — CONVERTED CEMR LAB
Glucose, Bld: 112 mg/dL
Hgb A1c MFr Bld: 6.1 %

## 2007-12-18 ENCOUNTER — Ambulatory Visit (HOSPITAL_COMMUNITY): Admission: RE | Admit: 2007-12-18 | Discharge: 2007-12-18 | Payer: Self-pay | Admitting: Family Medicine

## 2007-12-23 ENCOUNTER — Ambulatory Visit (HOSPITAL_BASED_OUTPATIENT_CLINIC_OR_DEPARTMENT_OTHER): Admission: RE | Admit: 2007-12-23 | Discharge: 2007-12-23 | Payer: Self-pay | Admitting: Orthopedic Surgery

## 2007-12-23 ENCOUNTER — Encounter (INDEPENDENT_AMBULATORY_CARE_PROVIDER_SITE_OTHER): Payer: Self-pay | Admitting: Orthopedic Surgery

## 2008-01-04 ENCOUNTER — Encounter (INDEPENDENT_AMBULATORY_CARE_PROVIDER_SITE_OTHER): Payer: Self-pay | Admitting: Family Medicine

## 2008-01-04 ENCOUNTER — Ambulatory Visit: Payer: Self-pay | Admitting: Physical Medicine & Rehabilitation

## 2008-01-07 ENCOUNTER — Ambulatory Visit: Payer: Self-pay | Admitting: Family Medicine

## 2008-01-07 DIAGNOSIS — G5600 Carpal tunnel syndrome, unspecified upper limb: Secondary | ICD-10-CM | POA: Insufficient documentation

## 2008-01-08 LAB — CONVERTED CEMR LAB
AST: 18 units/L (ref 0–37)
Alkaline Phosphatase: 82 units/L (ref 39–117)
BUN: 7 mg/dL (ref 6–23)
Creatinine, Ser: 0.75 mg/dL (ref 0.40–1.20)
Total Bilirubin: 0.4 mg/dL (ref 0.3–1.2)

## 2008-01-18 ENCOUNTER — Ambulatory Visit (HOSPITAL_COMMUNITY): Admission: RE | Admit: 2008-01-18 | Discharge: 2008-01-18 | Payer: Self-pay | Admitting: Family Medicine

## 2008-01-18 ENCOUNTER — Encounter (INDEPENDENT_AMBULATORY_CARE_PROVIDER_SITE_OTHER): Payer: Self-pay | Admitting: Family Medicine

## 2008-02-01 ENCOUNTER — Ambulatory Visit: Payer: Self-pay | Admitting: Physical Medicine & Rehabilitation

## 2008-02-01 ENCOUNTER — Encounter
Admission: RE | Admit: 2008-02-01 | Discharge: 2008-05-01 | Payer: Self-pay | Admitting: Physical Medicine & Rehabilitation

## 2008-02-05 ENCOUNTER — Telehealth (INDEPENDENT_AMBULATORY_CARE_PROVIDER_SITE_OTHER): Payer: Self-pay | Admitting: Family Medicine

## 2008-02-05 ENCOUNTER — Ambulatory Visit: Payer: Self-pay | Admitting: Family Medicine

## 2008-02-08 ENCOUNTER — Ambulatory Visit: Payer: Self-pay | Admitting: Family Medicine

## 2008-02-21 ENCOUNTER — Encounter (INDEPENDENT_AMBULATORY_CARE_PROVIDER_SITE_OTHER): Payer: Self-pay | Admitting: Family Medicine

## 2008-03-03 ENCOUNTER — Ambulatory Visit: Payer: Self-pay | Admitting: Physical Medicine & Rehabilitation

## 2008-03-04 ENCOUNTER — Encounter (INDEPENDENT_AMBULATORY_CARE_PROVIDER_SITE_OTHER): Payer: Self-pay | Admitting: Family Medicine

## 2008-03-08 ENCOUNTER — Ambulatory Visit: Payer: Self-pay | Admitting: Physical Medicine & Rehabilitation

## 2008-03-14 ENCOUNTER — Ambulatory Visit: Payer: Self-pay | Admitting: Internal Medicine

## 2008-03-24 ENCOUNTER — Ambulatory Visit: Payer: Self-pay | Admitting: Family Medicine

## 2008-04-01 ENCOUNTER — Telehealth (INDEPENDENT_AMBULATORY_CARE_PROVIDER_SITE_OTHER): Payer: Self-pay | Admitting: *Deleted

## 2008-04-04 ENCOUNTER — Encounter (INDEPENDENT_AMBULATORY_CARE_PROVIDER_SITE_OTHER): Payer: Self-pay | Admitting: Family Medicine

## 2008-04-05 ENCOUNTER — Encounter (INDEPENDENT_AMBULATORY_CARE_PROVIDER_SITE_OTHER): Payer: Self-pay | Admitting: Family Medicine

## 2008-04-05 ENCOUNTER — Telehealth (INDEPENDENT_AMBULATORY_CARE_PROVIDER_SITE_OTHER): Payer: Self-pay | Admitting: *Deleted

## 2008-04-08 ENCOUNTER — Ambulatory Visit: Payer: Self-pay | Admitting: Physical Medicine & Rehabilitation

## 2008-04-08 ENCOUNTER — Encounter
Admission: RE | Admit: 2008-04-08 | Discharge: 2008-07-07 | Payer: Self-pay | Admitting: Physical Medicine & Rehabilitation

## 2008-04-11 ENCOUNTER — Telehealth (INDEPENDENT_AMBULATORY_CARE_PROVIDER_SITE_OTHER): Payer: Self-pay | Admitting: *Deleted

## 2008-04-18 ENCOUNTER — Ambulatory Visit: Payer: Self-pay | Admitting: Family Medicine

## 2008-04-19 ENCOUNTER — Encounter (INDEPENDENT_AMBULATORY_CARE_PROVIDER_SITE_OTHER): Payer: Self-pay | Admitting: Family Medicine

## 2008-05-02 ENCOUNTER — Telehealth (INDEPENDENT_AMBULATORY_CARE_PROVIDER_SITE_OTHER): Payer: Self-pay | Admitting: Family Medicine

## 2008-05-09 ENCOUNTER — Encounter (INDEPENDENT_AMBULATORY_CARE_PROVIDER_SITE_OTHER): Payer: Self-pay | Admitting: Family Medicine

## 2008-05-13 ENCOUNTER — Encounter (INDEPENDENT_AMBULATORY_CARE_PROVIDER_SITE_OTHER): Payer: Self-pay | Admitting: Family Medicine

## 2008-05-31 ENCOUNTER — Ambulatory Visit: Payer: Self-pay | Admitting: Physical Medicine & Rehabilitation

## 2008-08-01 ENCOUNTER — Encounter (INDEPENDENT_AMBULATORY_CARE_PROVIDER_SITE_OTHER): Payer: Self-pay | Admitting: Family Medicine

## 2008-08-10 ENCOUNTER — Encounter
Admission: RE | Admit: 2008-08-10 | Discharge: 2008-11-08 | Payer: Self-pay | Admitting: Physical Medicine & Rehabilitation

## 2008-08-12 ENCOUNTER — Encounter
Admission: RE | Admit: 2008-08-12 | Discharge: 2008-08-12 | Payer: Self-pay | Admitting: Physical Medicine & Rehabilitation

## 2008-08-12 ENCOUNTER — Ambulatory Visit: Payer: Self-pay | Admitting: Physical Medicine & Rehabilitation

## 2008-08-17 ENCOUNTER — Ambulatory Visit: Payer: Self-pay | Admitting: Family Medicine

## 2008-08-19 ENCOUNTER — Encounter (INDEPENDENT_AMBULATORY_CARE_PROVIDER_SITE_OTHER): Payer: Self-pay | Admitting: *Deleted

## 2008-08-19 LAB — CONVERTED CEMR LAB
ALT: 29 units/L (ref 0–35)
AST: 22 units/L (ref 0–37)
Alkaline Phosphatase: 82 units/L (ref 39–117)
Band Neutrophils: 0 % (ref 0–10)
Calcium: 9.7 mg/dL (ref 8.4–10.5)
Chloride: 104 meq/L (ref 96–112)
Creatinine, Ser: 0.81 mg/dL (ref 0.40–1.20)
Creatinine, Urine: 113.9 mg/dL
Eosinophils Absolute: 0.6 10*3/uL (ref 0.0–0.7)
LDL Cholesterol: 93 mg/dL (ref 0–99)
Lymphocytes Relative: 37 % (ref 12–46)
Lymphs Abs: 3.8 10*3/uL (ref 0.7–4.0)
Microalb, Ur: 0.7 mg/dL (ref 0.00–1.89)
Neutrophils Relative %: 52 % (ref 43–77)
RBC: 5.32 M/uL — ABNORMAL HIGH (ref 3.87–5.11)
TSH: 2.304 microintl units/mL (ref 0.350–4.500)
Total CHOL/HDL Ratio: 3
VLDL: 29 mg/dL (ref 0–40)
WBC: 10.5 10*3/uL (ref 4.0–10.5)

## 2008-09-01 ENCOUNTER — Ambulatory Visit: Payer: Self-pay | Admitting: Family Medicine

## 2008-09-01 DIAGNOSIS — D696 Thrombocytopenia, unspecified: Secondary | ICD-10-CM

## 2008-09-07 ENCOUNTER — Ambulatory Visit: Payer: Self-pay | Admitting: Physical Medicine & Rehabilitation

## 2008-09-27 ENCOUNTER — Encounter (INDEPENDENT_AMBULATORY_CARE_PROVIDER_SITE_OTHER): Payer: Self-pay | Admitting: Family Medicine

## 2008-10-01 ENCOUNTER — Encounter (INDEPENDENT_AMBULATORY_CARE_PROVIDER_SITE_OTHER): Payer: Self-pay | Admitting: Family Medicine

## 2008-10-03 ENCOUNTER — Encounter (INDEPENDENT_AMBULATORY_CARE_PROVIDER_SITE_OTHER): Payer: Self-pay | Admitting: Family Medicine

## 2008-10-03 ENCOUNTER — Encounter (HOSPITAL_COMMUNITY): Admission: RE | Admit: 2008-10-03 | Discharge: 2008-11-02 | Payer: Self-pay | Admitting: Oncology

## 2008-10-03 ENCOUNTER — Ambulatory Visit (HOSPITAL_COMMUNITY): Payer: Self-pay | Admitting: Oncology

## 2008-10-05 ENCOUNTER — Encounter (INDEPENDENT_AMBULATORY_CARE_PROVIDER_SITE_OTHER): Payer: Self-pay | Admitting: Family Medicine

## 2008-10-05 ENCOUNTER — Ambulatory Visit: Payer: Self-pay | Admitting: Physical Medicine & Rehabilitation

## 2008-10-06 ENCOUNTER — Encounter (INDEPENDENT_AMBULATORY_CARE_PROVIDER_SITE_OTHER): Payer: Self-pay | Admitting: Family Medicine

## 2008-10-18 ENCOUNTER — Encounter (INDEPENDENT_AMBULATORY_CARE_PROVIDER_SITE_OTHER): Payer: Self-pay | Admitting: Family Medicine

## 2008-10-28 ENCOUNTER — Telehealth (INDEPENDENT_AMBULATORY_CARE_PROVIDER_SITE_OTHER): Payer: Self-pay | Admitting: Family Medicine

## 2008-11-01 ENCOUNTER — Ambulatory Visit: Payer: Self-pay | Admitting: Family Medicine

## 2008-11-01 DIAGNOSIS — R161 Splenomegaly, not elsewhere classified: Secondary | ICD-10-CM

## 2008-11-01 DIAGNOSIS — K7460 Unspecified cirrhosis of liver: Secondary | ICD-10-CM | POA: Insufficient documentation

## 2008-11-01 DIAGNOSIS — I8500 Esophageal varices without bleeding: Secondary | ICD-10-CM | POA: Insufficient documentation

## 2008-11-01 LAB — CONVERTED CEMR LAB: Hgb A1c MFr Bld: 6 %

## 2008-11-02 ENCOUNTER — Telehealth: Payer: Self-pay | Admitting: Internal Medicine

## 2008-11-02 ENCOUNTER — Ambulatory Visit: Payer: Self-pay | Admitting: Physical Medicine & Rehabilitation

## 2008-11-09 ENCOUNTER — Telehealth (INDEPENDENT_AMBULATORY_CARE_PROVIDER_SITE_OTHER): Payer: Self-pay | Admitting: *Deleted

## 2008-11-11 ENCOUNTER — Telehealth (INDEPENDENT_AMBULATORY_CARE_PROVIDER_SITE_OTHER): Payer: Self-pay | Admitting: *Deleted

## 2008-11-11 ENCOUNTER — Ambulatory Visit: Payer: Self-pay | Admitting: Family Medicine

## 2008-11-14 ENCOUNTER — Ambulatory Visit: Payer: Self-pay | Admitting: Family Medicine

## 2008-11-14 ENCOUNTER — Encounter (INDEPENDENT_AMBULATORY_CARE_PROVIDER_SITE_OTHER): Payer: Self-pay | Admitting: Family Medicine

## 2008-11-14 ENCOUNTER — Other Ambulatory Visit: Admission: RE | Admit: 2008-11-14 | Discharge: 2008-11-14 | Payer: Self-pay | Admitting: Family Medicine

## 2008-11-15 ENCOUNTER — Encounter (INDEPENDENT_AMBULATORY_CARE_PROVIDER_SITE_OTHER): Payer: Self-pay | Admitting: Family Medicine

## 2008-11-16 ENCOUNTER — Encounter (INDEPENDENT_AMBULATORY_CARE_PROVIDER_SITE_OTHER): Payer: Self-pay | Admitting: *Deleted

## 2008-11-16 ENCOUNTER — Encounter (INDEPENDENT_AMBULATORY_CARE_PROVIDER_SITE_OTHER): Payer: Self-pay | Admitting: Family Medicine

## 2008-11-17 ENCOUNTER — Encounter
Admission: RE | Admit: 2008-11-17 | Discharge: 2009-02-15 | Payer: Self-pay | Admitting: Physical Medicine & Rehabilitation

## 2008-11-21 ENCOUNTER — Ambulatory Visit (HOSPITAL_COMMUNITY): Admission: RE | Admit: 2008-11-21 | Discharge: 2008-11-21 | Payer: Self-pay | Admitting: Family Medicine

## 2008-11-21 ENCOUNTER — Encounter (INDEPENDENT_AMBULATORY_CARE_PROVIDER_SITE_OTHER): Payer: Self-pay | Admitting: Family Medicine

## 2008-11-23 LAB — CONVERTED CEMR LAB
ALT: 22 units/L (ref 0–35)
Alkaline Phosphatase: 72 units/L (ref 39–117)
Basophils Absolute: 0.1 10*3/uL (ref 0.0–0.1)
Basophils Relative: 1 % (ref 0–1)
Creatinine, Ser: 0.9 mg/dL (ref 0.40–1.20)
Eosinophils Absolute: 0.4 10*3/uL (ref 0.0–0.7)
MCHC: 33.6 g/dL (ref 30.0–36.0)
Neutro Abs: 5 10*3/uL (ref 1.7–7.7)
Neutrophils Relative %: 58 % (ref 43–77)
Platelets: 108 10*3/uL — ABNORMAL LOW (ref 150–400)
RBC: 4.81 M/uL (ref 3.87–5.11)
RDW: 14.3 % (ref 11.5–15.5)
Sodium: 141 meq/L (ref 135–145)
Total Bilirubin: 0.4 mg/dL (ref 0.3–1.2)
Total Protein: 6.7 g/dL (ref 6.0–8.3)

## 2008-11-30 ENCOUNTER — Encounter (INDEPENDENT_AMBULATORY_CARE_PROVIDER_SITE_OTHER): Payer: Self-pay | Admitting: Family Medicine

## 2008-12-02 ENCOUNTER — Ambulatory Visit: Payer: Self-pay | Admitting: Physical Medicine & Rehabilitation

## 2008-12-14 ENCOUNTER — Ambulatory Visit: Payer: Self-pay | Admitting: Internal Medicine

## 2008-12-14 DIAGNOSIS — R1319 Other dysphagia: Secondary | ICD-10-CM

## 2008-12-14 DIAGNOSIS — Z860100 Personal history of colon polyps, unspecified: Secondary | ICD-10-CM | POA: Insufficient documentation

## 2008-12-14 DIAGNOSIS — R932 Abnormal findings on diagnostic imaging of liver and biliary tract: Secondary | ICD-10-CM

## 2008-12-14 LAB — CONVERTED CEMR LAB
A-1 Antitrypsin, Ser: 192 mg/dL (ref 83–200)
Anti Nuclear Antibody(ANA): NEGATIVE
HCV Ab: NEGATIVE
Hep B S Ab: NEGATIVE

## 2008-12-15 ENCOUNTER — Ambulatory Visit: Payer: Self-pay | Admitting: Internal Medicine

## 2008-12-16 ENCOUNTER — Telehealth (INDEPENDENT_AMBULATORY_CARE_PROVIDER_SITE_OTHER): Payer: Self-pay | Admitting: *Deleted

## 2008-12-28 ENCOUNTER — Ambulatory Visit: Payer: Self-pay | Admitting: Physical Medicine & Rehabilitation

## 2009-01-09 ENCOUNTER — Encounter: Payer: Self-pay | Admitting: Internal Medicine

## 2009-01-09 ENCOUNTER — Ambulatory Visit (HOSPITAL_BASED_OUTPATIENT_CLINIC_OR_DEPARTMENT_OTHER)
Admission: RE | Admit: 2009-01-09 | Discharge: 2009-01-09 | Payer: Self-pay | Admitting: Physical Medicine & Rehabilitation

## 2009-01-11 ENCOUNTER — Ambulatory Visit: Payer: Self-pay | Admitting: Internal Medicine

## 2009-01-14 ENCOUNTER — Ambulatory Visit: Payer: Self-pay | Admitting: Internal Medicine

## 2009-01-18 ENCOUNTER — Ambulatory Visit: Payer: Self-pay | Admitting: Internal Medicine

## 2009-01-23 ENCOUNTER — Ambulatory Visit: Payer: Self-pay | Admitting: Family Medicine

## 2009-01-23 DIAGNOSIS — E669 Obesity, unspecified: Secondary | ICD-10-CM | POA: Insufficient documentation

## 2009-01-23 LAB — CONVERTED CEMR LAB: Glucose, Bld: 153 mg/dL

## 2009-01-30 ENCOUNTER — Encounter
Admission: RE | Admit: 2009-01-30 | Discharge: 2009-04-30 | Payer: Self-pay | Admitting: Physical Medicine & Rehabilitation

## 2009-02-02 ENCOUNTER — Ambulatory Visit: Payer: Self-pay | Admitting: Physical Medicine & Rehabilitation

## 2009-02-08 ENCOUNTER — Ambulatory Visit: Payer: Self-pay | Admitting: Internal Medicine

## 2009-02-20 ENCOUNTER — Ambulatory Visit: Payer: Self-pay | Admitting: Family Medicine

## 2009-03-23 ENCOUNTER — Ambulatory Visit: Payer: Self-pay | Admitting: Physical Medicine & Rehabilitation

## 2009-03-24 ENCOUNTER — Encounter (INDEPENDENT_AMBULATORY_CARE_PROVIDER_SITE_OTHER): Payer: Self-pay | Admitting: Family Medicine

## 2009-04-07 ENCOUNTER — Ambulatory Visit: Payer: Self-pay | Admitting: Internal Medicine

## 2009-04-07 ENCOUNTER — Telehealth: Payer: Self-pay | Admitting: Internal Medicine

## 2009-04-17 ENCOUNTER — Telehealth (INDEPENDENT_AMBULATORY_CARE_PROVIDER_SITE_OTHER): Payer: Self-pay | Admitting: *Deleted

## 2009-04-21 ENCOUNTER — Encounter
Admission: RE | Admit: 2009-04-21 | Discharge: 2009-06-07 | Payer: Self-pay | Admitting: Physical Medicine & Rehabilitation

## 2009-04-21 ENCOUNTER — Ambulatory Visit: Payer: Self-pay | Admitting: Physical Medicine & Rehabilitation

## 2009-05-01 ENCOUNTER — Telehealth: Payer: Self-pay | Admitting: Internal Medicine

## 2009-05-02 ENCOUNTER — Encounter: Payer: Self-pay | Admitting: Internal Medicine

## 2009-05-08 ENCOUNTER — Encounter: Payer: Self-pay | Admitting: Internal Medicine

## 2009-05-08 ENCOUNTER — Telehealth: Payer: Self-pay | Admitting: Internal Medicine

## 2009-05-19 ENCOUNTER — Ambulatory Visit: Payer: Self-pay | Admitting: Physical Medicine & Rehabilitation

## 2009-06-26 ENCOUNTER — Ambulatory Visit: Payer: Self-pay | Admitting: Internal Medicine

## 2009-06-27 ENCOUNTER — Encounter
Admission: RE | Admit: 2009-06-27 | Discharge: 2009-09-25 | Payer: Self-pay | Admitting: Physical Medicine & Rehabilitation

## 2009-07-10 ENCOUNTER — Telehealth: Payer: Self-pay | Admitting: Internal Medicine

## 2009-07-20 ENCOUNTER — Ambulatory Visit: Payer: Self-pay | Admitting: Physical Medicine & Rehabilitation

## 2009-08-11 ENCOUNTER — Encounter (INDEPENDENT_AMBULATORY_CARE_PROVIDER_SITE_OTHER): Payer: Self-pay | Admitting: *Deleted

## 2009-08-18 ENCOUNTER — Ambulatory Visit: Payer: Self-pay | Admitting: Physical Medicine & Rehabilitation

## 2009-09-21 ENCOUNTER — Ambulatory Visit: Payer: Self-pay | Admitting: Internal Medicine

## 2009-09-21 ENCOUNTER — Encounter (INDEPENDENT_AMBULATORY_CARE_PROVIDER_SITE_OTHER): Payer: Self-pay

## 2009-09-21 DIAGNOSIS — G4730 Sleep apnea, unspecified: Secondary | ICD-10-CM

## 2009-09-21 DIAGNOSIS — G4700 Insomnia, unspecified: Secondary | ICD-10-CM | POA: Insufficient documentation

## 2009-10-04 ENCOUNTER — Telehealth: Payer: Self-pay | Admitting: Internal Medicine

## 2009-11-23 ENCOUNTER — Encounter: Payer: Self-pay | Admitting: Internal Medicine

## 2010-01-08 ENCOUNTER — Telehealth: Payer: Self-pay | Admitting: Internal Medicine

## 2010-01-09 ENCOUNTER — Telehealth: Payer: Self-pay | Admitting: Internal Medicine

## 2010-01-11 ENCOUNTER — Ambulatory Visit: Payer: Self-pay | Admitting: Internal Medicine

## 2010-01-19 ENCOUNTER — Ambulatory Visit (HOSPITAL_COMMUNITY): Admission: RE | Admit: 2010-01-19 | Discharge: 2010-01-19 | Payer: Self-pay | Admitting: Urology

## 2010-02-27 ENCOUNTER — Encounter: Admission: RE | Admit: 2010-02-27 | Discharge: 2010-02-27 | Payer: Self-pay | Admitting: Orthopedic Surgery

## 2010-04-04 ENCOUNTER — Ambulatory Visit: Payer: Self-pay | Admitting: Pain Medicine

## 2010-04-18 ENCOUNTER — Telehealth: Payer: Self-pay | Admitting: Internal Medicine

## 2010-04-29 ENCOUNTER — Encounter: Payer: Self-pay | Admitting: Internal Medicine

## 2010-05-09 ENCOUNTER — Encounter: Admission: RE | Admit: 2010-05-09 | Discharge: 2010-05-09 | Payer: Self-pay | Admitting: Anesthesiology

## 2010-05-09 ENCOUNTER — Encounter: Admission: RE | Admit: 2010-05-09 | Discharge: 2010-05-09 | Payer: Self-pay | Admitting: Family Medicine

## 2010-05-09 ENCOUNTER — Encounter: Admission: RE | Admit: 2010-05-09 | Discharge: 2010-05-09 | Payer: Self-pay | Admitting: Orthopedic Surgery

## 2010-05-10 ENCOUNTER — Encounter: Admission: RE | Admit: 2010-05-10 | Discharge: 2010-05-10 | Payer: Self-pay | Admitting: Family Medicine

## 2010-05-10 ENCOUNTER — Encounter: Payer: Self-pay | Admitting: Internal Medicine

## 2010-05-17 ENCOUNTER — Ambulatory Visit: Payer: Self-pay | Admitting: Internal Medicine

## 2010-05-17 DIAGNOSIS — J40 Bronchitis, not specified as acute or chronic: Secondary | ICD-10-CM

## 2010-05-23 ENCOUNTER — Telehealth (INDEPENDENT_AMBULATORY_CARE_PROVIDER_SITE_OTHER): Payer: Self-pay | Admitting: *Deleted

## 2010-06-07 ENCOUNTER — Ambulatory Visit: Payer: Self-pay | Admitting: Internal Medicine

## 2010-07-02 ENCOUNTER — Encounter
Admission: RE | Admit: 2010-07-02 | Discharge: 2010-07-02 | Payer: Self-pay | Source: Home / Self Care | Attending: Anesthesiology | Admitting: Anesthesiology

## 2010-07-10 ENCOUNTER — Other Ambulatory Visit: Payer: Self-pay | Admitting: Family Medicine

## 2010-07-10 DIAGNOSIS — Z1239 Encounter for other screening for malignant neoplasm of breast: Secondary | ICD-10-CM

## 2010-07-10 NOTE — Progress Notes (Signed)
Summary: ? re meds  Phone Note Call from Patient Call back at Home Phone [phone]   Caller: Patient Call For: Kielyn Kardell Reason for Call: Talk to Nurse Summary of Call: Patient has questions regarding meds that her pain specialist wants her to take Initial call taken by: Tawni Levy,  July 10, 2009 9:58 AM  Follow-up for Phone Call         Dr.Schwartz at pain clinic is asking he to make a decision about switching  meds. for long term pain control from Oxycodone and Methocarbomol to Tramadol and and Baclofen and she is asking for your input due to her liver disease. Follow-up by: Teryl Lucy RN,  July 10, 2009 10:23 AM  Additional Follow-up for Phone Call Additional follow up Details #1::        The pain specialist needs to know that she has liver disease and that drugs that are metabolized via the liver might be affected. Additional Follow-up by: Hilarie Fredrickson MD,  July 10, 2009 10:49 AM    Additional Follow-up for Phone Call Additional follow up Details #2::    Pt. made aware of Dr.Maiyah Goyne's recommendations. Follow-up by: Teryl Lucy RN,  July 10, 2009 2:04 PM

## 2010-07-10 NOTE — Assessment & Plan Note (Signed)
Summary: ALT W/ ROV-CPAP-PER CY-KATIE//kp   Vital Signs:  Patient profile:   55 year old female Height:      64 inches Weight:      224.50 pounds BMI:     38.67 O2 Sat:      97 % on Room air Pulse rate:   99 / minute BP sitting:   150 / 78  (right arm) Cuff size:   large  Vitals Entered By: Reynaldo Minium CMA (June 26, 2009 2:30 PM)  O2 Flow:  Room air  Copy to:  n/a Primary Provider/Referring Provider:  Franchot Heidelberg, MD  CC:  follow up visit-sleep and Allergy Testing.Marland Kitchen  History of Present Illness: April 07, 2009- 52 yoF who comes with her husband to discuss sleep apnea. She was first diagnosed years ago in [place].  She had seen Dr Shelle Iron and used CPAP fopr 12 years, always feeling better with it. That machine no longer works. Dr Shelle Iron told her last year she no longer needed it. She says sleeping on side makes her back hurt. She wakes from sleep gasping. A new NPSG for Dr Riley Kill was notable for numerous meds taken, Nocturia x 3, mild OSA with AHI 6.2, RDI 13/hr, moderate snoring. Bedtime 8-10 PM, sleep latency 10-15 min, waking 5-6 x/ night,  before up at 630 AM.  She sleeps with extra pillows or in recliner. No sleeping pills but many sedating meds. No hx of thyroid problems. With CPAP she sleeps better without snoring and with less daytime sleepiness. Complains of allergy/ sinus discomfort. Frequent sinus congestion, sneezing, watery nose. Skin tested years ago said positive, treated with allegra and singulair. Some asthma in past.  June 26, 2009- OSA, Allergic rhinitis Husband tells her she is sleeping much better on CPAP 11. She is comfortable with this, using all night everynight. She is optiminstic and clearly feels she can stay with this. Allergy - she has been off antihistamines for 2 weeks and is having much more nasal congestion. She is doing Neti pot. Sometimes wheeze or wheezy cough. She started some leftover amoxacillin to avoid getting bronchitis. Got  flu vax. Hasn't smoked in 17 days.  Skin test- Pos grass, weed, tree, mite  Current Medications (verified): 1)  Metformin Hcl 500 Mg Tabs (Metformin Hcl) .... Two in Am and One At Night 2)  Cymbalta 60 Mg Cpep (Duloxetine Hcl) .... One Daily - Dr. Raliegh Scarlet 3)  Trazodone Hcl 100 Mg Tabs (Trazodone Hcl) .... 2 By Mouth Once Daily 4)  Requip 3 Mg Tabs (Ropinirole Hcl) .... Take One Tablet in The Morning and One Tablet At Bedtime 5)  Vistaril 50 Mg  Caps (Hydroxyzine Pamoate) .... Two At Bedtime  -  and 1 Prn Dr. Raliegh Scarlet 6)  Singulair 10 Mg Tabs (Montelukast Sodium) .... Once Daily 7)  Cozaar 50 Mg Tabs (Losartan Potassium) .... Take 1 By Mouth Once Daily 8)  Advair Diskus 250-50 Mcg/dose Misc (Fluticasone-Salmeterol) .... One Puff Two Times A Day 9)  Migranal   Soln (Dihydroergotamine Mesylate Soln) .... Use Up To 1 Puff Each Nostril Up To Every 15 Minutes Times 4 For Relief - Dr. Dana Allan Up To 4 Uses 10)  Oxycodone Hcl 5 Mg Tabs (Oxycodone Hcl) .... Two Tablets By Mouth Every 6 Hours As Needed 11)  Topamax 200 Mg Tabs (Topiramate) .... One Tablet By Mouth At Bedtime 12)  Abilify 5 Mg  Tabs (Aripiprazole) .... One At Bedtime - Dr. Raliegh Scarlet 13)  Dhe 45 .Marland Kitchen.. 1 Mg/ml Times 2  Eight Hours Apart As Needed For Migraine - Dr. Sharyne Peach As Needed 14)  Gabapentin 600 Mg  Tabs (Gabapentin) .... One Three Times A Day 15)  Lasix 20 Mg  Tabs (Furosemide) .... One Daily As Needed 16)  Proventil Hfa 108 (90 Base) Mcg/act  Aers (Albuterol Sulfate) .... 2 Puffs Every 6 Hours As Needed 17)  Caltrate 600+d Plus 600-400 Mg-Unit  Tabs (Calcium Carbonate-Vit D-Min) .... Two Times A Day 18)  Fexofenadine Hcl 180 Mg  Tabs (Fexofenadine Hcl) .... One Daily 19)  Docusate Sodium 100 Mg Tabs (Docusate Sodium) .... One Daily As Needed 20)  Centrum Silver  Tabs (Multiple Vitamins-Minerals) .... One Daily 21)  Eq Fiber Therapy 0.52 Gm Caps (Psyllium) .... Take 5 Capsules in 8 Oz of H20 With Onset of Constipation 22)   Methocarbamol 500 Mg Tabs (Methocarbamol) .Marland Kitchen.. 1 Every 6 Hours Up To Two Times A Day 23)  Cpap 11 Cwp .... Dx Obstructive Sleep Apnea 24)  Cpap Mask of Choice, Humidifier, Supplies  Allergies (verified): 1)  ! Erythromycin 2)  ! Wellbutrin 3)  ! Haldol 4)  ! Neosporin 5)  ! * Latex  Past History:  Past Surgical History: Last updated: 01/06/09 1. Bladder Lift 1993 2. Cerebreal Anuerysm 2008 3. Tonsillectomy at age 39 4. Laproscpic surgery abdomen for "intestin infection" - 1984 5. Bilteral CTS releases x 2 1996 and 2009 6. Partial  hysterectomy at age 75 - unclear why this was done. 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 Married Current Smoker  1/2 PPD Alcohol use-no Drug use-no Education: OT Lives with spouse Daily Caffeine Use: 2-3 glasses a day   Risk Factors: Alcohol Use: 0 (11/01/2008) Exercise: no (11/01/2008)  Risk Factors: Smoking Status: quit (11/01/2008) Packs/Day: 1.0 (11/01/2008)  Past Medical History: DEGENERATIVE DISC DISEASE, 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 RESTLESS LEG SYNDROME (ICD-333.94) FIBROMYALGIA  (ICD-729.1) ARTHRITIS (ICD-716.90) PUD (ICD-533.90) BRONCHITIS (ICD-490) OSTEOARTHRITIS (ICD-715.90) HYPERTENSION (ICD-401.9) HYPERLIPIDEMIA (ICD-272.4) GERD (ICD-530.81) DIABETES MELLITUS, TYPE II, CONTROLLED (ICD-250.00) DEPRESSION (ICD-311) ASTHMA (ICD-493.90) ANXIETY (ICD-300.00) ALLERGIC RHINITIS (ICD-477.9) Skin test POS 06/26/09 Obesity Cirrhosis  Review of Systems      See HPI  The patient denies anorexia, fever, weight loss, weight gain, vision loss, decreased hearing, hoarseness, chest pain, syncope, dyspnea on exertion, peripheral edema, prolonged cough, headaches, hemoptysis, abdominal pain, and severe indigestion/heartburn.    Physical Exam  Additional Exam:  General: A/Ox3; pleasant and cooperative, NAD, obese SKIN: no rash, lesions NODES: no lymphadenopathy HEENT: [place]/AT, EOM- WNL, Conjuctivae- clear, PERRLA, TM-WNL, Nose- stuffy, Throat- clear and wnl, Melampatti III NECK: Supple w/ fair ROM, JVD- none, normal carotid impulses w/o bruits Thyroid-  CHEST: Clear to P&A, raspy cough HEART: RRR, no m/g/r heard ABDOMEN: Soft and nl;  XBJ:YNWG, nl pulses, no edema  NEURO: Grossly intact to observation  Impression & Recommendations:  Problem # 1:  Hx of SLEEP APNEA (ICD-780.57) She is comfortable and compliant, feeling better with cpap than without. We will continue pressure at 11.  Problem # 2:  ALLERGIC RHINITIS (ICD-477.9) We discussed treatment options. We will have her use antinhistamines, nasal steroids and environmental/ dust control measures. She is going to discuss cost of allergy vaccine with the billing specialist. We discussed risk and realistic expectations of allergy vaccine as a treatment option. Her updated medication list for this problem includes:    Fexofenadine Hcl 180 Mg Tabs (Fexofenadine hcl) ..... One daily  Medications Added to Medication List This Visit: 1)  Cozaar 50 Mg Tabs (Losartan potassium) .... Take 1 by mouth once  daily  Other Orders: Admin of Therapeutic Inj  intramuscular or subcutaneous (78295) Depo- Medrol 80mg  (J1040)  Patient Instructions: 1)  Please schedule a follow-up appointment in 2 months. 2)  Try otc antihistamine Zyrtec/ cetirizine. This should be cheaper than  fexofenadine. 3)  Stop Singulair to save money.  4)  Depo shot today 5)  Ask billing secretary at the front about cost of allergy vaccine for you.    Medication Administration  Injection # 1:    Medication: Depo- Medrol 80mg     Diagnosis: ASTHMA (ICD-493.90)    Route: SQ    Site: LUOQ gluteus    Exp Date: 03/2010    Lot #: [id number]    Mfr: Teva    Patient tolerated injection without complications    Given by: Vivianne Spence  Orders Added: 1)  Admin of Therapeutic Inj  intramuscular or subcutaneous [96372] 2)  Depo- Medrol 80mg  [J1040]   Appended Document: Orders Update Correction of billing for visit date   Clinical Lists Changes  Orders: Added new Service order of Est. Patient Level II (78469) - Signed Added new Service order of Allergy Puncture Test (62952) - Signed Added new Service order of Allergy I.D Test (84132) - Signed

## 2010-07-10 NOTE — Progress Notes (Signed)
Summary: discuss medication / surgery  Phone Note Call from Patient Call back at Home Phone [phone]   Caller: Patient Call For: Dr. Marina Goodell Reason for Call: Talk to Nurse Summary of Call: would like to increase med dosage and discuss LAP BAND surgery Initial call taken by: Vallarie Mare,  April 18, 2010 12:49 PM  Follow-up for Phone Call        Pt. says she had a stroke and her neurologist in [place] wants her to ask Dr.Perry if she can be on ASA 81mg . daily as a prevention. Also wants Dr.Perry's opinion as to whether  he feels she is a candidate for lap band surgery. Follow-up by: Teryl Lucy RN,  April 18, 2010 3:19 PM  Additional Follow-up for Phone Call Additional follow up Details #1::        baby aspirin is okay. As far as surgery, she will need to consult with a bariatric surgeon Additional Follow-up by: Hilarie Fredrickson MD,  April 18, 2010 3:30 PM    Additional Follow-up for Phone Call Additional follow up Details #2::    Discussed Dr.Perry's recommendations with pt. Follow-up by: Teryl Lucy RN,  April 18, 2010 4:09 PM

## 2010-07-10 NOTE — Assessment & Plan Note (Signed)
Summary: rov 2 months///kp   Copy to:  n/a Primary Provider/Referring Provider:  Franchot Heidelberg, MD  CC:  2 month follow up visit-couhg, runny nose and eyes, and rash..  History of Present Illness:  June 26, 2009- OSA, Allergic rhinitis Husband tells her she is sleeping much better on CPAP 11. She is comfortable with this, using all night everynight. She is optimistic and clearly feels she can stay with this. Allergy - she has been off antihistamines for 2 weeks and is having much more nasal congestion. She is doing Neti pot. Sometimes wheeze or wheezy cough. She started some leftover amoxacillin to avoid getting bronchitis. Got flu vax. Hasn't smoked in 17 days.  Skin test- Pos grass, weed, tree, mite  September 21, 2009- OSA, Allergic rhinitis Allergies have gotten worse. Very bad especially a week ago. Itching eyes and watery eyes and nose. she has been scratching herself in sleep. Wheezing - takes her Advair. This week she is better and feels overdried. has been taking Allegra 180. She asks about allergy shots and discussed possibility of getting them through [facility] where she gets her primary care. They are financially limited and on disability. Fexofenadine 180 is covered by her plan. She can't afford singulair.   Current Medications (verified): 1)  Metformin Hcl 500 Mg Tabs (Metformin Hcl) .... Two in Am and One At Night 2)  Cymbalta 60 Mg Cpep (Duloxetine Hcl) .... One Daily - Dr. Raliegh Scarlet 3)  Trazodone Hcl 100 Mg Tabs (Trazodone Hcl) .... 2 By Mouth Once Daily 4)  Requip 3 Mg Tabs (Ropinirole Hcl) .... Take One Tablet in The Morning and One Tablet At Bedtime 5)  Vistaril 50 Mg  Caps (Hydroxyzine Pamoate) .... Two At Bedtime  -  and 1 Prn Dr. Raliegh Scarlet 6)  Singulair 10 Mg Tabs (Montelukast Sodium) .... Once Daily 7)  Cozaar 50 Mg Tabs (Losartan Potassium) .... Take 1 By Mouth Once Daily 8)  Advair Diskus 250-50 Mcg/dose Misc (Fluticasone-Salmeterol) .... One  Puff Two Times A Day 9)  Migranal   Soln (Dihydroergotamine Mesylate Soln) .... Use Up To 1 Puff Each Nostril Up To Every 15 Minutes Times 4 For Relief - Dr. Dana Allan Up To 4 Uses 10)  Tramadol Hcl 50 Mg Tabs (Tramadol Hcl) .... Take 1 By Mouth Once Daily 11)  Topamax 200 Mg Tabs (Topiramate) .... One Tablet By Mouth At Bedtime 12)  Dhe 45 .Marland Kitchen.. 1 Mg/ml Times 2 Eight Hours Apart As Needed For Migraine - Dr. Sharyne Peach As Needed 13)  Gabapentin 600 Mg  Tabs (Gabapentin) .... One Three Times A Day 14)  Lasix 20 Mg  Tabs (Furosemide) .... One Daily As Needed 15)  Caltrate 600+d Plus 600-400 Mg-Unit  Tabs (Calcium Carbonate-Vit D-Min) .... Two Times A Day 16)  Fexofenadine Hcl 180 Mg  Tabs (Fexofenadine Hcl) .... One Daily 17)  Docusate Sodium 100 Mg Tabs (Docusate Sodium) .... One Daily As Needed 18)  Centrum Silver  Tabs (Multiple Vitamins-Minerals) .... One Daily 19)  Eq Fiber Therapy 0.52 Gm Caps (Psyllium) .... Take 5 Capsules in 8 Oz of H20 With Onset of Constipation 20)  Baclofen 20 Mg Tabs (Baclofen) .... Take 1 By Mouth Ever 6 Hours Up To Two Times A Day 21)  Cpap 11 Cwp .... Dx Obstructive Sleep Apnea 22)  Cpap Mask of Choice, Humidifier, Supplies  Allergies (verified): 1)  ! Erythromycin 2)  ! Wellbutrin 3)  ! Haldol 4)  ! Neosporin 5)  ! 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 RESTLESS LEG SYNDROME (ICD-333.94) FIBROMYALGIA  (ICD-729.1) ARTHRITIS (ICD-716.90) PUD (ICD-533.90) BRONCHITIS (ICD-490) OSTEOARTHRITIS (ICD-715.90) HYPERTENSION (ICD-401.9) HYPERLIPIDEMIA (ICD-272.4) GERD (ICD-530.81) DIABETES MELLITUS, TYPE II, CONTROLLED (ICD-250.00) DEPRESSION (ICD-311) ASTHMA (ICD-493.90) ANXIETY (ICD-300.00) ALLERGIC RHINITIS (ICD-477.9) Skin test POS 06/26/09 Obesity Cirrhosis  Past Surgical History: Last updated: Dec 25, 2008 1. Bladder Lift 1993 2. Cerebreal Anuerysm 2008 3. Tonsillectomy at age 72 4. Laproscpic surgery abdomen for "intestin infection" - 1984 5. Bilteral CTS releases x 2 1996 and 2009 6. Partial  hysterectomy at age 55 - unclear why this was done. 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    Vital Signs:  Patient profile:   55 year old female Height:      64 inches Weight:      233 pounds BMI:     40.14 O2 Sat:      97 % on Room air Pulse rate:   94 / minute BP sitting:   122 / 80  (left arm) Cuff size:   large  Vitals  Entered By: Reynaldo Minium CMA (September 21, 2009 9:59 AM)  O2 Flow:  Room air  Physical Exam  Additional Exam:  General: A/Ox3; pleasant and cooperative, NAD, obese SKIN: no rash, lesions NODES: no lymphadenopathy HEENT: [place]/AT, EOM- WNL, Conjuctivae- clear, PERRLA, TM-WNL, Nose- stuffy, Throat- clear and wnl, Mallampati III NECK: Supple w/ fair ROM, JVD- none, normal carotid impulses w/o bruits Thyroid-  CHEST: Clear to P&A, no cough or wheeze HEART: RRR, no m/g/r heard ABDOMEN: Soft and nl;  ZOX:WRUE, nl pulses, no edema  NEURO: Grossly intact to observation      Impression & Recommendations:  Problem # 1:  ALLERGIC RHINITIS (ICD-477.9)  Severe exacerbation last week sounds allergic by description but was very short duration. She now seems overdried/ overmedicated. We will back off some by trying alternate day use of fexofenadine.  Her updated medication list for this problem includes:    Fexofenadine Hcl 180 Mg Tabs (Fexofenadine hcl) ..... One daily  Problem # 2:  ASTHMA (ICD-493.90) Good control currently. She had wheeze with her limited duration flare last week. She is not really needing Singulair which is taken off her list.  Problem # 3:  INSOMNIA WITH  SLEEP APNEA UNSPECIFIED (ICD-780.51) She remains compliant with CPAP at 11 used every night and sleeps better with it. She says the insomnia problem persists, still with difficulty staying asleep. She doesn't snore through her cpap. We will let her try  some temazepam. Otc Benadryl etc don't help.  Medications Added to Medication List This Visit: 1)  Tramadol Hcl 50 Mg Tabs (Tramadol hcl) .... Take 1 by mouth once daily 2)  Baclofen 20 Mg Tabs (Baclofen) .... Take 1 by mouth ever 6 hours up to two times a day 3)  Temazepam 15 Mg Caps (Temazepam) .Marland Kitchen.. 1 or 2 for sleep if needed.  Other Orders: Est. Patient Level III (95284)  Patient Instructions: 1)  Please schedule a follow-up appointment in 4 months. 2)  Script for  temazepam for sleep 3)  Try taking fexofenadine every other day to reduce over drying. If needed, you might try taking weaker otc claritin/ loratadine on the in-between days. 4)  Ask my billing lady for help understanding insurance coverage for your allergy shots/ vaccine. "one vial" 5)  Ask your primary medical office about getting your allergy shots there.  Prescriptions: TEMAZEPAM 15 MG CAPS (TEMAZEPAM) 1 or 2 for sleep if needed.  #30 x 5   Entered and Authorized by:   Waymon Budge MD   Signed by:   Waymon Budge MD on 09/21/2009   Method used:   Print then Give to Patient   RxID:   [id number]

## 2010-07-10 NOTE — Miscellaneous (Signed)
Summary: CPAP Machine/[facility]  CPAP Machine/[facility]   Imported By: Sherian Rein 08/02/2009 07:29:56  _____________________________________________________________________  External Attachment:    Type:   Image     Comment:   External Document

## 2010-07-10 NOTE — Assessment & Plan Note (Signed)
Summary: bronchitis/kcw   Copy to:  n/a Primary Provider/Referring Provider:  Dr Doroteo Bradford, MD in [place], [place]  CC:  Acute visit-cough, heaviness in lungs, and recent Bronchitis.Marland Kitchen  History of Present Illness: June 26, 2009- OSA, Allergic rhinitis Husband tells her she is sleeping much better on CPAP 11. She is comfortable with this, using all night everynight. She is optimistic and clearly feels she can stay with this. Allergy - she has been off antihistamines for 2 weeks and is having much more nasal congestion. She is doing Neti pot. Sometimes wheeze or wheezy cough. She started some leftover amoxacillin to avoid getting bronchitis. Got flu vax. Hasn't smoked in 17 days.  Skin test- Pos grass, weed, tree, mite  September 21, 2009- OSA, Allergic rhinitis Allergies have gotten worse. Very bad especially a week ago. Itching eyes and watery eyes and nose. she has been scratching herself in sleep. Wheezing - takes her Advair. This week she is better and feels overdried. has been taking Allegra 180. She asks about allergy shots and discussed possibility of getting them through [facility] medical office where she gets her primary care. They are financially limited and on disability. Fexofenadine 180 is covered by her plan. She can't afford singulair.  May 17, 2010-  OSA, Allergic rhinitis Nurse-CC: Acute visit-cough, heaviness in lungs, recent Bronchitis. Continues CPAP all night every night- helps her sleep. DGD lumbar spine pain keeps her awake.  She has decided not to take allergy shots.  Onset nasal drainage 4 mos ago. PCP treated w/ abx, no help. Progressed to bronchitis, Rx'd doxy, pred course x 2. She couldn't afford nebulizer. Used Proair, Veramyst. QS 2 weeks ago. Clear mucus, scant.  Coughs from mid chest CT- 05/10/10- old granulomatous disease, neg PE. Marland Kitchen      Asthma History    Initial Asthma Severity Rating:    Age range: 12+ years    Symptoms: 0-2 days/week  Nighttime Awakenings: 0-2/month    Interferes w/ normal activity: no limitations    SABA use (not for EIB): 0-2 days/week    Asthma Severity Assessment: Intermittent   Preventive Screening-Counseling & Management  Alcohol-Tobacco     Alcohol drinks/day: 0     Smoking Status: quit     Smoking Cessation Counseling: yes     Smoke Cessation Stage: quit     Packs/Day: 1.0     Year Started: Age 27     Year Quit: 2011     Pack years: 64  Comments: Occasional single puff to fight craving.   Current Medications (verified): 1)  Metformin Hcl 500 Mg Tabs (Metformin Hcl) .... Take 1 By Mouth Once Daily 2)  Cymbalta 60 Mg Cpep (Duloxetine Hcl) .... One Daily - Dr. Raliegh Scarlet 3)  Trazodone Hcl 100 Mg Tabs (Trazodone Hcl) .... 2 By Mouth Once Daily 4)  Requip 4 Mg Tabs (Ropinirole Hcl) .... Take 1 By Mouth At Bedtime 5)  Vistaril 50 Mg  Caps (Hydroxyzine Pamoate) .... Two At Bedtime  -  and 1 Prn Dr. Raliegh Scarlet 6)  Migranal   Soln (Dihydroergotamine Mesylate Soln) .... Use Up To 1 Puff Each Nostril Up To Every 15 Minutes Times 4 For Relief - Dr. Dana Allan Up To 4 Uses 7)  Topamax 200 Mg Tabs (Topiramate) .... One Tablet By Mouth At Bedtime 8)  Dhe 45 .Marland Kitchen.. 1 Mg/ml Times 2 Eight Hours Apart As Needed For Migraine - Dr. Sharyne Peach As Needed 9)  Gabapentin 800 Mg Tabs (Gabapentin) .... Take 1  By Mouth Three Times A Day 10)  Lasix 20 Mg  Tabs (Furosemide) .... One Daily As Needed 11)  Cpap 11 Cwp .... Dx Obstructive Sleep Apnea 12)  Cpap Mask of Choice, Humidifier, Supplies 13)  Temazepam 15 Mg Caps (Temazepam) .Marland Kitchen.. 1 or 2 For Sleep If Needed. 14)  Losartan Potassium 50 Mg Tabs (Losartan Potassium) .... Take 1 By Mouth Every Evening 15)  Diclofenac Sodium 75 Mg Tbec (Diclofenac Sodium) .... Take 1 By Mouth Every 12 Hours As Needed Pain 16)  Veramyst 27.5 Mcg/spray Susp (Fluticasone Furoate) .Marland Kitchen.. 1 Spray in Each Nostril Once Daily 17)  Misoprostol 200 Mcg Tabs (Misoprostol) .... Take 1 By Mouth Every 12  Hours As Needed Nausea  Allergies (verified): 1)  ! Erythromycin 2)  ! Wellbutrin 3)  ! Haldol 4)  ! Neosporin 5)  ! 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 RESTLESS LEG SYNDROME (ICD-333.94) FIBROMYALGIA (ICD-729.1) ARTHRITIS (ICD-716.90) PUD (ICD-533.90) BRONCHITIS (ICD-490) OSTEOARTHRITIS (ICD-715.90) HYPERTENSION (ICD-401.9) HYPERLIPIDEMIA (ICD-272.4) GERD (ICD-530.81) DIABETES MELLITUS, TYPE II, CONTROLLED (ICD-250.00) DEPRESSION (ICD-311) ASTHMA (ICD-493.90) ANXIETY (ICD-300.00) ALLERGIC RHINITIS (ICD-477.9) Skin test POS 06/26/09 Obesity Cirrhosis  Past Surgical History: Last updated: 12/22/08 1. Bladder Lift 1993 2. Cerebreal Anuerysm 2008 3. Tonsillectomy at age 89 4. Laproscpic surgery abdomen for "intestin infection" - 1984 5. Bilteral CTS releases x 2 1996 and 2009 6. Partial  hysterectomy at age 46 - unclear why this was done. Carpal Tunnel Release bilateral   Family History: Last updated: Dec 22, 2008 Father: Dead 5 Alzheimers, CVA Mother: Dead 56 Uterine and Lung Cancer, COPD Sister - adopted age 64 Kids - 2 girls - 64 - healthy, one dead at 48 due gunshot wound to head by accident No FH of Colon Cancer: Family History of Heart Disease: Mother and Aunt  Social History: Last updated: 04/07/2009 Occupation: OT - disabled related to Fibromylagia Married Current Smoker  1/2 PPD Alcohol use-no Drug  use-no Education: OT Lives with spouse Daily Caffeine Use: 2-3 glasses a day   Risk Factors: Alcohol Use: 0 (05/17/2010) Exercise: no (11/01/2008)  Risk Factors: Smoking Status: quit (05/17/2010) Packs/Day: 1.0 (05/17/2010)  Review of Systems      See HPI       The patient complains of shortness of breath with activity, non-productive cough, and nasal congestion/difficulty breathing through nose.  The patient denies shortness of breath at rest, productive cough, coughing up blood, chest pain, irregular heartbeats, acid heartburn, indigestion, loss of appetite, weight change, abdominal pain, difficulty swallowing, sore throat, tooth/dental problems, headaches, sneezing, itching, ear ache, hand/feet swelling, rash, change in color of mucus, and fever.    Vital Signs:  Patient profile:   55 year old female Height:      64 inches Weight:      225 pounds BMI:     38.76 O2 Sat:      94 % on Room air Pulse rate:   100 / minute BP sitting:   128 / 70  (left arm) Cuff size:   large  Vitals Entered By: Reynaldo Minium CMA (May 17, 2010 9:49 AM)  O2 Flow:  Room air CC: Acute  visit-cough, heaviness in lungs, recent Bronchitis.   Physical Exam  Additional Exam:  General: A/Ox3; pleasant and cooperative, NAD, obese SKIN: no rash, lesions NODES: no lymphadenopathy HEENT: [place]/AT, EOM- WNL, Conjuctivae- clear, PERRLA, TM-WNL, Nose- stuffy, Throat- clear and wnl, Mallampati III, dry mucosa, hoarse NECK: Supple w/ fair ROM, JVD- none, normal carotid impulses w/o bruits Thyroid-  CHEST: Clear to P&A, no wheeze, but cough w/ deep breath.  HEART: RRR, no m/g/r heard ABDOMEN: Soft and nl;  YQI:HKVQ, nl pulses, no edema  NEURO: Grossly intact to observation      Impression & Recommendations:  Problem # 1:  BRONCHITIS NOT SPECIFIED AS ACUTE OR CHRONIC (ICD-490)  Sustained tracheobronchitis in smoker w/ hx asthma. Fortunately the CT and CXR didn't suggest a neoplasm or pneumonia. We  will work with mucinex, hydration, neb, depo  The following medications were removed from the medication list:    Singulair 10 Mg Tabs (Montelukast sodium) ..... Once daily    Advair Diskus 250-50 Mcg/dose Misc (Fluticasone-salmeterol) ..... One puff two times a day Her updated medication list for this problem includes:    Amoxicillin 500 Mg Caps (Amoxicillin) .Marland Kitchen... 1 three times a day x 10 days    Promethazine Vc/codeine 6.25-5-10 Mg/61ml Syrp (Phenyleph-promethazine-cod) .Marland Kitchen... 1 teaspoon four times a day as needed cough  Medications Added to Medication List This Visit: 1)  Metformin Hcl 500 Mg Tabs (Metformin hcl) .... Take 1 by mouth once daily 2)  Requip 4 Mg Tabs (Ropinirole hcl) .... Take 1 by mouth at bedtime 3)  Gabapentin 800 Mg Tabs (Gabapentin) .... Take 1 by mouth three times a day 4)  Losartan Potassium 50 Mg Tabs (Losartan potassium) .... Take 1 by mouth every evening 5)  Diclofenac Sodium 75 Mg Tbec (Diclofenac sodium) .... Take 1 by mouth every 12 hours as needed pain 6)  Veramyst 27.5 Mcg/spray Susp (Fluticasone furoate) .Marland Kitchen.. 1 spray in each nostril once daily 7)  Misoprostol 200 Mcg Tabs (Misoprostol) .... Take 1 by mouth every 12 hours as needed nausea 8)  Amoxicillin 500 Mg Caps (Amoxicillin) .Marland Kitchen.. 1 three times a day x 10 days 9)  Promethazine Vc/codeine 6.25-5-10 Mg/71ml Syrp (Phenyleph-promethazine-cod) .Marland Kitchen.. 1 teaspoon four times a day as needed cough  Other Orders: Est. Patient Level III (25956) Admin of Therapeutic Inj  intramuscular or subcutaneous (38756) Depo- Medrol 80mg  (J1040) Nebulizer Tx (43329)  Patient Instructions: 1)  Please schedule a follow-up appointment in 3 weeks. Please call sooner if needed 2)  Plain Mucinex and extra fluids can help loosen mucus 3)  Script for cough syrup 4)  Script for amoxacillin 5)  neb xop 1.25 6)  depo 80  Prescriptions: PROMETHAZINE VC/CODEINE 6.25-5-10 MG/5ML SYRP (PHENYLEPH-PROMETHAZINE-COD) 1 teaspoon four times a  day as needed cough  #200 ml x 1   Entered and Authorized by:   Waymon Budge MD   Signed by:   Waymon Budge MD on 05/17/2010   Method used:   Print then Give to Patient   RxID:   [id number] AMOXICILLIN 500 MG CAPS (AMOXICILLIN) 1 three times a day x 10 days  #30 x 0   Entered and Authorized by:   Waymon Budge MD   Signed by:   Waymon Budge MD on 05/17/2010   Method used:   Print then Give to Patient   RxID:   [id number]      Medication Administration  Injection # 1:    Medication: Depo- Medrol 80mg   Diagnosis: BRONCHITIS NOT SPECIFIED AS ACUTE OR CHRONIC (ICD-490)    Route: SQ    Site: LUOQ gluteus    Exp Date: 10/2012    Lot #: [id number]    Mfr: Pharmacia    Patient tolerated injection without complications    Given by: Reynaldo Minium CMA (May 17, 2010 12:02 PM)  Medication # 1:    Medication: Xopenex 1.25mg     Diagnosis: BRONCHITIS NOT SPECIFIED AS ACUTE OR CHRONIC (ICD-490)    Dose: 1 vial    Route: inhaled    Exp Date: 01/2011    Lot #: [id number]    Mfr: Sepracor    Patient tolerated medication without complications    Given by: Reynaldo Minium CMA (May 17, 2010 12:03 PM)  Orders Added: 1)  Est. Patient Level III [14782] 2)  Admin of Therapeutic Inj  intramuscular or subcutaneous [96372] 3)  Depo- Medrol 80mg  [J1040] 4)  Nebulizer Tx [95621]

## 2010-07-10 NOTE — Progress Notes (Signed)
Summary: Triage  ---- Converted from flag ---- ---- 01/09/2010 1:36 PM, Hilarie Fredrickson MD wrote: Not aware of that...  ---- 01/09/2010 1:27 PM, Karna Christmas wrote: Pt. called wanting to sch'd Twinrix Booster and REV. In IDX it is stating pt. has been discharged from practice. Britta Mccreedy called pt. yesterday to sch'd her Twinrix Booster but there is NO message in EMR where pt. has been discharged. Are you aware of pt. being discharged? ------------------------------  Phone Note Outgoing Call   Call placed by: Milford Cage NCMA,  January 09, 2010 1:42 PM Call placed to: Patient Summary of Call: Called patient and advised her to come in Thursday for her TwinRix Booster.  I also made her appt. to see Dr. Marina Goodell per patient.   Initial call taken by: Milford Cage NCMA,  January 09, 2010 1:44 PM

## 2010-07-10 NOTE — Letter (Signed)
Summary: Colonoscopy Letter  [facility]  [address] [place], [place] [address]   Phone: [phone]  Fax: [phone]      August 11, 2009 MRN: [id number]   [facility] [address] [place], [place]  [address]   Dear Ms. [name],   According to your medical record, it is time for you to schedule a Colonoscopy. The American Cancer Society recommends this procedure as a method to detect early colon cancer. Patients with a family history of colon cancer, or a personal history of colon polyps or inflammatory bowel disease are at increased risk.  This letter has beeen generated based on the recommendations made at the time of your procedure. If you feel that in your particular situation this may no longer apply, please contact our office.  Please call our office at [phone] to schedule this appointment or to update your records at your earliest convenience.  Thank you for cooperating with [place] to provide you with the very best care possible.   Sincerely,  Wilhemina Bonito. Marina Goodell, M.D.  [facility] [phone]

## 2010-07-10 NOTE — Miscellaneous (Signed)
Summary: Skin Test/[facility]  Skin Test/[facility]   Imported By: Sherian Rein 07/08/2009 10:23:47  _____________________________________________________________________  External Attachment:    Type:   Image     Comment:   External Document

## 2010-07-10 NOTE — Assessment & Plan Note (Signed)
Summary: twinrix booster/bs  Nurse Visit   Allergies: 1)  ! Erythromycin 2)  ! Wellbutrin 3)  ! Haldol 4)  ! Neosporin 5)  ! * Latex  Immunizations Administered:  TwinRix # 4:    Vaccine Type: TwinRix    Site: left deltoid    Mfr: GlaxoSmithKline    Dose: 0.1 ml    Route: IM    Given by: Ashok Cordia RN    Exp. Date: 03/16/2012    Lot #: [id number]    VIS given: 02/26/07 version given January 11, 2010.  Orders Added: 1)  TwinRix 1ml ( Hep A&B Adult dose) [90636] 2)  Admin 1st Vaccine [90471]

## 2010-07-10 NOTE — Letter (Signed)
Summary: CMN/[facility]  CMN/[facility]   Imported By: Lester [place] 12/06/2009 12:04:53  _____________________________________________________________________  External Attachment:    Type:   Image     Comment:   External Document

## 2010-07-10 NOTE — Progress Notes (Signed)
Summary: medication question  Phone Note Call from Patient Call back at Home Phone [phone]   Caller: Patient Call For: Lanyla Costello Reason for Call: Talk to Nurse Summary of Call: Have questions about fexofenadine. Initial call taken by: Darletta Moll,  October 04, 2009 10:36 AM  Follow-up for Phone Call        Pt states she was told by CY to try OTC fexofenadine.  However OTC for 20 pills is $23.  States she can get fexofinadine 180mg  with rx for $5 for 30 pills and states she can then cut pills in half to actually have a 60 day supply.  Requesting for CY to send rx to [facility] for this.  Will forward message to CY -pls advise if this is ok.  Thanks! Follow-up by: Gweneth Dimitri RN,  October 04, 2009 10:54 AM  Additional Follow-up for Phone Call Additional follow up Details #1::        That is fine. Mark the script at one daily so they give her 30 tabs. she can split them. Refill as needed  Additional Follow-up by: Waymon Budge MD,  October 04, 2009 12:51 PM    Additional Follow-up for Phone Call Additional follow up Details #2::    rx sent. pt aware. Carron Curie CMA  October 04, 2009 1:26 PM   Prescriptions: FEXOFENADINE HCL 180 MG  TABS (FEXOFENADINE HCL) One daily  #30 x 3   Entered by:   Carron Curie CMA   Authorized by:   Waymon Budge MD   Signed by:   Carron Curie CMA on 10/04/2009   Method used:   Electronically to        [facility] [phone]* (retail)       [address]       [place], [place]  [address]       Ph: [phone] or [phone]       Fax: [phone]   RxID:   [id number]

## 2010-07-10 NOTE — Letter (Signed)
Summary: CMN/[facility]  CMN/[facility]   Imported By: Lester [place] 05/01/2010 12:07:35  _____________________________________________________________________  External Attachment:    Type:   Image     Comment:   External Document

## 2010-07-10 NOTE — Letter (Signed)
Summary: Recall Colonoscopy/Endoscopy, Change to Office Visit  [facility]  [address]   [place], [place] [address]   Phone: [phone]  Fax: [phone]      September 21, 2009   [name] [address] [place], [place]  [address] 06/24/1955   Dear [name],   According to our records, it is time for you to schedule a Colonoscopy/Endoscopy. However, after reviewing your medical record, we recommend an office visit in order to determine your need for a repeat procedure.  Please call [phone] at your convenience to schedule an office visit. If you have any questions or concerns, please feel free to contact our office.   Sincerely,   Cloria Spring LPN  [facility] Ph: [phone]   Fax: [phone]

## 2010-07-10 NOTE — Progress Notes (Signed)
Summary: Remider of Twinrix Booster.  ---- Converted from flag ---- ---- 02/07/2009 10:39 AM, Harlow Mares CMA (AAMA) wrote:  Pateint needs booster Twinrix on 01-11-2010 ------------------------------  Phone Note Outgoing Call   Call placed by: Milford Cage [place],  January 08, 2010 9:22 AM Call placed to: Patient Summary of Call: Called patient and left message that it is time for her Twinrix Booster on 01/11/10.  Will place in IDX. Initial call taken by: Milford Cage NCMA,  January 08, 2010 9:23 AM

## 2010-07-12 NOTE — Progress Notes (Signed)
Summary: pharmacy calling re: cough syrup rx  Phone Note From Pharmacy   Caller: traci [facility]/ [facility] in [place] Call For: young  Summary of Call: re: promethazine- only has this avail "with codeine only" not phenyleph. ok ? call [phone] or fax [phone] Initial call taken by: Tivis Ringer, CNA,  May 23, 2010 3:29 PM  Follow-up for Phone Call        Spoke with CDY-he is aware and states that its fine to give patient promethazine/codeine cough syrup; pharmacy is aware.Reynaldo Minium CMA  May 23, 2010 4:19 PM

## 2010-07-23 ENCOUNTER — Ambulatory Visit: Payer: Self-pay

## 2010-07-27 ENCOUNTER — Ambulatory Visit
Admission: RE | Admit: 2010-07-27 | Discharge: 2010-07-27 | Disposition: A | Discharge status: Home / Self Care | Payer: MEDICARE | Source: Ambulatory Visit | Attending: Family Medicine | Admitting: Family Medicine

## 2010-07-27 DIAGNOSIS — Z1239 Encounter for other screening for malignant neoplasm of breast: Secondary | ICD-10-CM

## 2010-08-06 ENCOUNTER — Ambulatory Visit: Payer: Self-pay | Admitting: Orthopedic Surgery

## 2010-08-07 ENCOUNTER — Ambulatory Visit: Payer: Self-pay | Admitting: Internal Medicine

## 2010-08-08 ENCOUNTER — Ambulatory Visit: Payer: Self-pay | Admitting: Internal Medicine

## 2010-08-10 ENCOUNTER — Ambulatory Visit: Payer: Self-pay | Admitting: Internal Medicine

## 2010-08-16 ENCOUNTER — Inpatient Hospital Stay: Payer: Self-pay | Admitting: Orthopedic Surgery

## 2010-08-28 ENCOUNTER — Ambulatory Visit: Payer: Self-pay | Admitting: Orthopedic Surgery

## 2010-09-09 ENCOUNTER — Ambulatory Visit: Payer: Self-pay | Admitting: Internal Medicine

## 2010-09-16 LAB — GLUCOSE, CAPILLARY
Glucose-Capillary: 105 mg/dL — ABNORMAL HIGH (ref 70–99)
Glucose-Capillary: 116 mg/dL — ABNORMAL HIGH (ref 70–99)

## 2010-09-19 LAB — IRON AND TIBC
Iron: 47 ug/dL (ref 42–135)
Saturation Ratios: 14 % — ABNORMAL LOW (ref 20–55)
UIBC: 281 ug/dL

## 2010-09-19 LAB — CBC
HCT: 43.3 % (ref 36.0–46.0)
MCV: 88.4 fL (ref 78.0–100.0)
Platelets: 119 10*3/uL — ABNORMAL LOW (ref 150–400)
RBC: 4.89 MIL/uL (ref 3.87–5.11)
WBC: 9.1 10*3/uL (ref 4.0–10.5)

## 2010-09-19 LAB — FERRITIN: Ferritin: 49 ng/mL (ref 10–291)

## 2010-09-19 LAB — VITAMIN B12: Vitamin B-12: 1578 pg/mL — ABNORMAL HIGH (ref 211–911)

## 2010-10-18 ENCOUNTER — Telehealth: Payer: Self-pay | Admitting: Internal Medicine

## 2010-10-18 NOTE — Telephone Encounter (Signed)
Dr Maple Hudson, pls advise if okay to refill her lorazepam. Last ov was 05/17/10 and no appts pending. thanks

## 2010-10-19 MED ORDER — TEMAZEPAM 15 MG PO CAPS: ORAL_CAPSULE | ORAL | Status: DC

## 2010-10-19 NOTE — Telephone Encounter (Signed)
Spoke w/ pt and she is not needing a refill on lorazepam. Pt is needing a refill on her temazepam. Spoke w/ Katie and she states it was fine to refill it. Pt aware rx was sent to pharamcy and is aware she needs to come in for ov for further refills. Pt is coming in 5/14 at 2:15 since this was an open slot on cdy schedule. Pt aware of apt date and time

## 2010-10-19 NOTE — Telephone Encounter (Signed)
1) needs a routine appointment 6 months or so after her last visit.  2) Ok to refill lorazepam till next visit.

## 2010-10-22 ENCOUNTER — Ambulatory Visit (INDEPENDENT_AMBULATORY_CARE_PROVIDER_SITE_OTHER): Payer: MEDICARE | Admitting: Internal Medicine

## 2010-10-22 ENCOUNTER — Encounter: Payer: Self-pay | Admitting: Internal Medicine

## 2010-10-23 NOTE — Procedures (Signed)
[name], [name]                ACCOUNT NO.:  [id number]   MEDICAL RECORD NO.:  [id number]          PATIENT TYPE:  REC   LOCATION:  [facility]PC                          FACILITY:  [facility]   PHYSICIAN:  Erick Colace, M.D.DATE OF BIRTH:  Jan 13, 1956   DATE OF PROCEDURE:  DATE OF DISCHARGE:                               OPERATIVE REPORT   PROCEDURE:  Right L5-S1 paramedian translaminar epidural steroid  injection under fluoroscopic guidance.   INDICATIONS:  Right greater than left lower extremity pain with back  pain.  Pain is only partially response to narcotic analgesic medications  and other conservative care and interferes with meal prep, household  duties, and shopping as well as limiting walking time.   Informed consent was obtained after describing risks and benefits of the  procedure to the patient.  These include bleeding, bruising, infection,  temporary or permanent paralysis, loss of bowel or bladder function.  The patient tolerated the procedure well and elects to proceed.   The patient placed prone on fluoroscopy table.  Betadine prep, sterile  drape, 25-gauge inch and a half needle was used to incise skin and subcu  tissue 1% lidocaine x2 mL.  Then an 18 gauge Houston needle was inserted  into the L5-S1 translaminar space under AP and lateral imaging.  Loss-of-  resistance technique was obtained while advancing under lateral views.  This is after a positive loss of resistance.  This was confirmed by  Omnipaque injected under live fluoro demonstrating good epidural spread  followed by injection of 2 mL of 40 mg/mL Depo-Medrol and 2 mL of 1% MPF  lidocaine.  The patient tolerated the procedure well.  Pre and post  injection vitals stable.  Post injection instructions given.  She will  follow up with Dr. Riley Kill in 1 month.      Erick Colace, M.D.  Electronically Signed     AEK/MEDQ  D:  07/27/2007 10:04:29  T:  07/28/2007 30:86:57  Job:  [id number]   cc:    Ranelle Oyster, M.D.  Fax: [phone]

## 2010-10-23 NOTE — Op Note (Signed)
NAME:  [name], [name]                ACCOUNT NO.:  [id number]   MEDICAL RECORD NO.:  [id number]          PATIENT TYPE:  AMB   LOCATION:  DSC                          FACILITY:  [facility]   PHYSICIAN:  Matthew A. Weingold, M.D.DATE OF BIRTH:  03/24/1956   DATE OF PROCEDURE:  12/23/2007  DATE OF DISCHARGE:                               OPERATIVE REPORT   PREOPERATIVE DIAGNOSIS:  Recurrent left carpal tunnel syndrome.   POSTOPERATIVE DIAGNOSIS:  Recurrent left carpal tunnel syndrome.   PROCEDURE:  Redo left carpal tunnel release through open incision and  limits flexor synovectomy.   SURGEON:  Artist Pais. Mina Marble, MD   ASSISTANT:  None.   ANESTHESIA:  Regional.   TOURNIQUET TIME:  30 minutes.   COMPLICATIONS:  None.   DRAINS:  None.   SPECIMEN SENT:  Once.   OPERATIVE REPORT:  The patient was taken to the operating suite.  After  induction of adequate regional anesthetic, the left upper extremity was  prepped and draped in sterile fashion.  Once this was done, an incision  was made in left hand incorporating her old carpal tunnel incision and  going to the area of the thenar crease in a Brunner fashion, but not  going onto the distal forearm.  Flaps were raised accordingly.  Dissection was carried down at the distal aspect of the transverse  carpal ligament area.  The median nerve was identified.  The transverse  colon was split from distal to proximal.  There was significant scarring  of the median nerve to the radial leaf so this was carefully dissected  free.  An external neurolysis was performed under loupe magnification.  There was significant amount of scarring at the nerve.  All scar tissue  was removed.  There was also significant amount of scarring among the  superficialis tendons.  This was carefully debrided using loupe  magnification and tenotomy scissors.  The median nerve was carefully  identified and retracted with the vessel loop, and again the  superficialis  tendons were debrided of excessive hypertrophic synovium.  The fundus tendons appeared to be with normal looking synovium.  The wound was then thoroughly irrigated.  Hemostasis was achieved with  bipolar cautery and then wound was loosely closed in a running 3-0  Prolene subcuticular stitch.  Steri-Strips, 4 x 4's, fluffs and a volar  splint was applied.  The patient tolerated the procedures well, went to  recovery room in stable fashion.      Artist Pais Mina Marble, M.D.  Electronically Signed     MAW/MEDQ  D:  12/23/2007  T:  12/24/2007  Job:  [id number]

## 2010-10-23 NOTE — Assessment & Plan Note (Signed)
[name] is back regarding her fibromyalgia and low back pain.  She had  good results with the lumbar RFs by Dr. Wynn Banker in September, and low  back pain is much improved.  She continues to have some symptoms between  the scapulae, however.  The pain between her shoulders seems to keep her  up at night.  She is trying to get some medication assistance through  the Department of Health for some of the medications we are prescribing.  She continues with the oxycodone for breakthrough pain.  We did not  change her over to [facility] per her psychiatrist preference.  She has not  been on Mestinon recently or ReQuip, although ReQuip has been helpful in  the past.  The patient rates her pain today is 6-8/10.  She describes  the pain as sharp, stabbing, constant, and aching.  Pain interferes with  general activity, in relationship with others, and enjoyment of life on  a moderate-to-severe level.  Sleep is poor.   REVIEW OF SYSTEMS:  Notable for tremor, spasm, confusion, depression,  anxiety, loss of taste, and weight loss.  She has lost 18 pounds.  Occasional skin rash and problems with appetite.  Full review is in the  written health and history section of the chart.   SOCIAL HISTORY:  The patient is married, living with her spouse.  No  other specific changes are noted.  She has been involved with vocational  rehab as of late due to health issues with her husband.   PHYSICAL EXAMINATION:  VITAL SIGNS:  Blood pressure is 140/62, pulse is  115, respiratory rate 16, and she is sating 96% on room air.  GENERAL:  The patient is pleasant, alert, and oriented x3.  Affect is  bright and appropriate.  EXTREMITIES:  Gait is stable.  She is able to bend to about 45 degrees  with pain.  She is able to extend much more easily today.  She had  myofascial trigger points in the thoracic paraspinals, left more than  right, at the level of the midscapula in the inferior medial border.  Strength is 5/5.   Sensory exam is grossly intact.  She had some distal  lower extremity edema.  She remains overweight, but may have lost some  weight since I last saw her.  HEART:  Regular rate.  CHEST:  Clear.  ABDOMEN:  Soft and nontender.   ASSESSMENT:  1. Lumbar spondylosis at L5-S1 with degenerative disk disease and      facet arthropathy.  She responded very well to the lumbar      radiofrequencies.  2. Partial thoracic/myofascial pain.  3. Fibromyalgia.  4. Migraine headaches.  5. Peripheral neuropathy.  6. Obesity.  7. Carpal tunnel syndrome.  8. Restless legs syndrome.  9. Insomnia.  10.Anxiety with depression.  11.Tobacco abuse.   PLAN:  1. Continue oxycodone 10 mg q.6 h p.r.n., #180.  2. I gave her 3 months' prescription for Rozerem 8 mg 2 tablets at      bedtime, #180, and Mestinon 60 mg b.i.d., #180.  3. ReQuip 2 mg b.i.d., has been filled previously.  4. I will see her back in about 2 months.  She may follow up for      prescriptions in a month.      Ranelle Oyster, M.D.  Electronically Signed     ZTS/MedQ  D:  04/08/2008 13:14:45  T:  04/09/2008 00:59:30  Job #:  [id number]   cc:   Remi Haggard  Erby Pian, M.D.

## 2010-10-23 NOTE — Procedures (Signed)
NAME:  [name], [name]                ACCOUNT NO.:  [id number]   MEDICAL RECORD NO.:  [id number]         PATIENT TYPE:  AECP   LOCATION:                                 FACILITY:   PHYSICIAN:  Erick Colace, M.D.DATE OF BIRTH:  08-28-1955   DATE OF PROCEDURE:  03/07/2008  DATE OF DISCHARGE:                               OPERATIVE REPORT   PROCEDURE:  Bilateral L5 radiofrequency neurotomy, bilateral L4  radiofrequency neurotomy, and bilateral L3 radiofrequency neurotomy  under lumbar and sacral fluoroscopic guidance.   INDICATIONS:  Lumbar spondylosis without myelopathy.  She has had  greater than 50% improvement on 2 sets of medial branch blocks performed  on January 18, 2008, and most recently on March 03, 2008.  Pain is  only partially responsive to medication management, other conservative  care, and interferes with self-care mobility.   Informed consent was obtained after describing the risks and benefits of  the procedure with the patient.  These include bleeding, bruising, and  infection.  She elects to proceed and has given written consent.  The  patient was placed prone on fluoroscopy table.  Betadine prep and  sterile draped.  A 25-gauge, 1-1/2-inch needle was used to anesthetize  the skin and subcu tissue with 1% lidocaine x 1.5 mL at each of the 6  sites.  Then, a 20-gauge 15-cm RF needle with a 10-mm straight active  tip was guided using fluoroscopic guidance.  AP, lateral, and oblique  images were utilized.  First left S1 SAP sacral ala junction was  targeted and bone contact made confirmed with lateral imaging.  Sensory  stem at 50 Hz followed by motor stem at 2 Hz confirmed proper needle  followed by injection of 0.5 mL of a solution containing 1 mL of 4 mg/mL  dexamethasone and 4 mL of 2% MPF lidocaine, followed by radiofrequency  lesioning at 70-degree Celsius for 70 seconds.  Then the left L5 SAP  transverse process junction, a targeted bone contact made  confirmed with  lateral imaging.  Sensory stem at 60 Hz followed by motor stem at 2 Hz  confirmed proper needle location followed by injection of 0.5 mL of  dexamethasone-lidocaine solution and radiofrequency lesioning at 70-  degree Celsius for 70 seconds.  Then, the left L4 SAP transverse process  junction, targeted bone contact made confirmed with lateral imaging.  Sensory stem at 50 Hz followed by motor stem at 2 Hz, confirmed proper  needle location followed by injection of 0.5 mL of the dexamethasone-  lidocaine solution followed by radiofrequency lesioning at 70-degree  Celsius for 70 seconds.  The same procedure was repeated on the right  side corresponding levels using same needle technique and injectate.  The patient tolerated the procedure well.  Pre- and post-injection and  vitals stable.  Post-injection instructions were given.  She will see  Dr. Riley Kill in next month.      Erick Colace, M.D.  Electronically Signed     AEK/MEDQ  D:  03/08/2008 15:40:30  T:  03/09/2008 03:52:31  Job:  [id number]

## 2010-10-23 NOTE — Assessment & Plan Note (Signed)
[facility]                             PULMONARY OFFICE NOTE   NAME:[name], [name]                       MRN:          [id number]  DATE:03/02/2007                            DOB:          Jan 28, 1956    SLEEP MEDICINE CONSULTATION   HISTORY OF PRESENT ILLNESS:  The patient is a very pleasant 55 year old  female whom I have been asked to see for persistent sleeping  difficulties.  The patient has a history of severe obstructive sleep  apnea approximately 10 years ago.  However, recently has had a sleep  study which showed no clinically significant sleep apnea noted.  The  patient feels that it is probably secondary to an event that occurred in  October of 2007 when she had a ruptured cerebral aneurysm along with CVA  and lost a great deal of weight.  Despite no longer having obstructive  sleep apnea, the patient is now having significant sleep issues, which  primarily deal with insomnia.  Complicating all of this, she has a very  severe chronic pain syndrome, as well as severe anxiety.  The patient  will typically get to bed between 9 and 10, and gets up between 4 and 6  a.m. to start her day.  She is not rested.  She has at least 3 to 4  awakenings during the night, and each one of these is accompanied by  prolonged awake time before she can get back to sleep.  The patient  feels very strongly a lot of this is related to severe chronic pain, but  she also admits that, once she is awake, she is very worried about her  husband, who snores and has pauses in his breathing, and wants to make  sure that he is continuing to take breaths.  She understands that a lot  of this is being fed by her severe anxiety.  The patient, during the  day, has very severe inappropriate daytime sleepiness with any period of  inactivity.  This, of course, is complicated by a lot of her medications  that she takes for her various medical issues.  Of note, the patient  states  that her weight is approximately 15 pounds less than it was 2  years ago.   PAST MEDICAL HISTORY:  1. Significant for hypertension.  2. Asthma.  3. Diabetes.  4. Dyslipidemia.  5. History of cerebral aneurysm rupture with CVA.  6. History of chronic headaches.  7. History of allergic rhinitis.  8. Status post hysterectomy.  9. History of depression and anxiety.  10.Chronic pain syndrome.  11.History of fibromyalgia.  12.History of multiple orthopedic procedures.   CURRENT MEDICATIONS:  1. Abilify 5 mg nightly.  2. Requip 32 mg XR 1 in the morning and 1 p.m. for restless leg      syndrome.  3. Fioricet 1 q.8h p.r.n. pain.  4. Metformin 500 mg extended release b.i.d.  5. Hydroxyzine 50 mg 1 b.i.d.  6. Tramadol 50 mg 1 to 2 q.8h p.r.n.  7. Cymbalta 60 mg q. a.m.  8. Skelaxin 800 mg t.i.d. p.r.n.  9. Meclizine 25 mg p.r.n.  10.Lipitor 20 mg daily.  11.Trazodone 200 mg at bedtime.  12.Topamax 100 mg at bedtime.  13.Avapro 150 mg daily.  14.Singulair 10 mg daily.  15.AcipHex 20 mg daily.  16.DHE injection daily p.r.n.  17.Neurontin 300 mg q.i.d.  18.Flonase p.r.n.  19.Advair 250/50 one daily.  20.Lunesta 3 mg nightly.   Various other vitamins and herbal preparations.  The patient has LATEX  allergy.  Otherwise, unremarkable.   SOCIAL HISTORY:  The patient is married and has children.  She has a  history of smoking 1 pack per day since she was 55 years old.   FAMILY HISTORY:  Remarkable for mother having emphysema, a daughter with  asthma.  Her mother also had heart disease.  Also various cancers in  multiple first degree relatives, including breast, uterine, and lung.   REVIEW OF SYSTEMS:  As per history of present illness.  Also see patient  intake form documented in the chart.   PHYSICAL EXAM:  GENERAL:  She is an obese white female in no acute  distress.  Blood pressure 124/70, pulse 108, temperature 98.3, weight 232 pounds.  She is 5 feet 4 inches tall. O2  saturation on room air is 98%.  HEENT:  Pupils are equal, round, and reactive to light and  accommodation.  Extraocular muscles are intact.  Nares are patent  without discharge.  Oropharynx shows mild elongation of the soft palate  and uvula.  NECK:  Supple without JVD or lymphadenopathy.  There is no palpable  thyromegaly.  CHEST:  Totally clear to auscultation.  CARDIAC:  Reveals regular rate and rhythm.  ABDOMEN:  Soft and nontender with good bowel sounds.  GENITAL, RECTAL, AND BREASTS:  Not done and not indicated.  LOWER EXTREMITIES:  Shows trace edema.  Pulses are intact distally.  NEUROLOGIC:  She is alert and oriented, and does move all 4 extremities.   IMPRESSION:  Chronic insomnia and daytime sleepiness, which at this  point in time is multifactorial.  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  She is on a very large amount of medication with a very high  resistance and continues to have difficulties.  At least she no longer  has obstructive sleep apnea.  I really think, at this point in time, she  is going to need a very aggressive coordination between doctors of  multiple specialties.  I think it will be essential that neurology,  psychiatry, and also pain management all be strongly involved in her  care and help coordinate her medical regimen.  She is on quite a bit of  medicines here, and I really do not have a whole lot new to offer her in  terms of pharmacology or sleeping advice.  Unfortunately, this is a very  deep-seated problem with multiple issues that are affecting her.   SUGGESTIONS:  I would highly recommend that the patient be referred to a  chronic pain specialist to see if changes in her medical regimen can  result in better pain control during the night, which should greatly  help her sleep.  Hopefully, the  sedating effects during the day can be  minimized with the change in her regimen as well.  I would also have her  continue with neurologic followup and also she would benefit from fairly  aggressive therapy  under the care of psychiatry.     Barbaraann Share, MD,FCCP  Electronically Signed    KMC/MedQ  DD: 03/03/2007  DT: 03/03/2007  Job #: [id number]   cc:   Franchot Heidelberg, M.D.

## 2010-10-23 NOTE — Procedures (Signed)
NAME:  [name], [name]                ACCOUNT NO.:  [id number]   MEDICAL RECORD NO.:  [id number]          PATIENT TYPE:  OUT   LOCATION:  SLEEP CENTER                 FACILITY:  [facility]   PHYSICIAN:  Clinton D. Maple Hudson, MD, FCCP, FACPDATE OF BIRTH:  08/01/1955   DATE OF STUDY:  01/09/2009                            NOCTURNAL POLYSOMNOGRAM   REFERRING PHYSICIAN:  Ranelle Oyster, M.D.   INDICATION FOR STUDY:  Insomnia with sleep apnea.   EPWORTH SLEEPINESS SCORE:  Epworth sleepiness score 14/24, BMI 39.5.  Weight 230 pounds, height 64 inches.  Neck 15.5 inches.   MEDICATIONS:  Home medications are charted and reviewed.   SLEEP ARCHITECTURE:  Total sleep time 369 minutes with sleep efficiency  84.2%.  Stage I was 11.5%, stage II 74.4%, stage III absent, REM 14.1%  of total sleep time.  Sleep latency 13 minutes, REM latency 160.5  minutes, awake after sleep onset 57 minutes, arousal index 25.7.  Bedtime medication:  Omeprazole, methocarbamol, ReQuip, oxycodone,  Neurontin, Vistaril, Abilify, trazodone, Topamax, Singulair, metformin.   RESPIRATORY DATA:  Apnea/hypopnea index (AHI) 6.2 per hour.  A total of  38 events were scored including 5 obstructive apneas, 1 central apnea, 1  mixed apnea and 31 hypopneas.  Events were more common while supine.  REM AHI 36.9 per hour.  An additional 42 minor events not meeting  duration and desaturation criteria as apneas or hypopneas, were scored  as respiratory effort related arousals for a cumulative respiratory  disturbance index (RDI) of 13 per hour.  This was a diagnostic NPSG  study.   OXYGEN DATA:  Moderate snoring with oxygen desaturation to a nadir of  87%.  Mean oxygen saturation through the study was 92.2% on room air.   CARDIAC DATA:  Sinus rhythm with occasional PVC.   MOVEMENT-PARASOMNIA:  No movement disturbance.  Bathroom x3.   IMPRESSIONS-RECOMMENDATIONS:  1. Extensive medication list as noted.  Sleep was also disturbed by  bathroom x3.  2. Mild obstructive sleep apnea/hypopnea syndrome, AHI 6.2 per hour,      RDI 13 per hour.  Events more common while supine.  Moderate      snoring with oxygen desaturation to a nadir of 87% on room air.  3. This was a diagnostic NPSG.  If conservative measures such as      weight loss, side sleeping, and treatment if      needed for nasal or upper airway congestion are insufficient, then      consider return for CPAP titration or evaluate for alternative      management as appropriate.      Clinton D. Maple Hudson, MD, [facility], FACP  Diplomate, Biomedical engineer of Sleep Medicine  Electronically Signed     CDY/MEDQ  D:  01/14/2009 11:37:03  T:  01/14/2009 12:34:20  Job:  [id number]

## 2010-10-23 NOTE — Procedures (Signed)
NAME:  [name], [name]                ACCOUNT NO.:  [id number]   MEDICAL RECORD NO.:  [id number]          PATIENT TYPE:  OUT   LOCATION:  SLEEP CENTER                 FACILITY:  [facility]   PHYSICIAN:  Clinton D. Maple Hudson, MD, FCCP, FACPDATE OF BIRTH:  [birth year]   DATE OF STUDY:  02/01/2007                            NOCTURNAL POLYSOMNOGRAM   REFERRING PHYSICIAN:  Franchot Heidelberg, M.D.   INDICATION FOR STUDY:  Hypersomnia with sleep apnea.   EPWORTH SLEEPINESS SCORE:  19/24, BMI 40.7, weight 230 pounds.   HOME MEDICATIONS:  Listed and reviewed.   SLEEP ARCHITECTURE:  Total sleep time 346 minutes.  The sleep efficiency  89%.  Stage I was 6%, stage 69%, stage III absent, REM 25% of total  sleep time.  Sleep latency 13 minutes.  REM latency 119 minutes.  Awake  after sleep onset 32 minutes.  Arousal index 8.7.  Bedtime medication  included Requip, Fioricet with codeine, metformin, Skelaxin,  benzonatate, Lipitor, trazodone, Topamax, Singulair, Aciphex, Neurontin,  Flonase, Mucinex, potassium.  Fioricet was taken at 3:55 a.m. for  complaint of fibromyalgia pain.   RESPIRATORY DATA:  Apnea/hypopnea index (AHI, RDI) 0.9 per hour, which  is within normal limits (normal range 0-5 per hour).  The events were  limited to 1 central apnea and 4 hypopneas, which were evenly  distributed while sleeping supine and on left side.  There were  insufficient events to permit use of CPAP titration by split protocol on  this study night.   OXYGEN DATA:  Variable snoring, sometimes loud with oxygen desaturation  transiently to a nadir of 88%, mean oxygen saturation through the study  was 94% on room air.   CARDIAC DATA:  Normal sinus rhythm.   MOVEMENT-PARASOMNIA:  No significant limb jerks.  Bathroom x2.   IMPRESSIONS-RECOMMENDATIONS:  1. Unremarkable sleep architecture for sleep center environment.      Note, the substantial number of potentially sedating medications      taken at bedtime.  2. Rare  respiratory events, AHI 0.9 per hour (normal range 0-5 per      hour).  This number of events does not appear      clinically significant noting that the patient had been using CPAP      at home previously.  A diagnosis of sleep apnea cannot be made from      this study.      Clinton D. Maple Hudson, MD, [facility], FACP  Diplomate, Biomedical engineer of Sleep Medicine  Electronically Signed     CDY/MEDQ  D:  02/07/2007 15:20:29  T:  02/08/2007 13:59:00  Job:  [id number]

## 2010-10-23 NOTE — Procedures (Signed)
[name], [name]                ACCOUNT NO.:  [id number]   MEDICAL RECORD NO.:  [id number]          PATIENT TYPE:  OUT   LOCATION:  [facility]                           FACILITY:  [facility]   PHYSICIAN:  Erick Colace, M.D.DATE OF BIRTH:  Jan 13, 1956   DATE OF PROCEDURE:  DATE OF DISCHARGE:  01/18/2008                               OPERATIVE REPORT   PROCEDURE:  Bilateral L5 dorsal ramus injection, bilateral L4 medial  branch block, and bilateral L3 medial branch block under fluoroscopic  guidance.   INDICATIONS:  Lumbar spondylosis without myelopathy with facet syndrome.   Pain had only a minimal response to prior epidural injections short  duration as well as minimal affect on visual analogue pain scores.   Informed consent was obtained after describing risks and benefits of the  procedure with the patient.  These include bleeding, bruising,  infection, loss of bowel or bladder function, temporary or permanent  paralysis.  She elected to proceed and has given written consent.  The  patient placed prone on fluoroscopy table.  Betadine prep and sterilely  draped.  A 25-gauge, 1-1/2-inch needle was used to anesthetize the skin  and subcu tissue, 1% lidocaine x2 mL, and 22-gauge, 3-1/2-inch spinal  needle was inserted under fluoroscopic guidance.  First targeting left  S1 SAP sacral ala junction, bone contact made, confirmed with lateral  imaging.  Omnipaque 180 x 0.5 mL demonstrated no intravascular uptake.  Then, 0.5 mL dexamethasone and lidocaine solution was injected.  Then,  the left L4 SAP transverse process junction targeted, bone contact made,  confirmed with lateral imaging.  Omnipaque 180 x0.5 mL demonstrated no  intravascular uptake.  Then, 0.5 mL of dexamethasone and lidocaine  solution was injected.  Then a left L4 SAP transverse process junction  targeted, bone contact made, confirmed with lateral imaging.  Omnipaque  20 x0.5 mL demonstrated no intravascular uptake, then  0.5 mL of  dexamethasone and lidocaine solution was injected.  The patient  tolerated the procedure well.  Pre- and post-injection vitals stable.  Post injection instructions given.  Pre-injection pain level 7/10 and  post-injection 3/10.  She will follow up with Dr. Riley Kill.  We can  reinject her if her pain recurs even if this is temporary improvement.  This is the information that helps confirm facet disease as a pain  generator.      Erick Colace, M.D.  Electronically Signed     AEK/MEDQ  D:  02/01/2008 13:47:05  T:  02/02/2008 04:32:34  Job:  [id number]

## 2010-10-23 NOTE — Assessment & Plan Note (Signed)
[name] is back regarding her fibromyalgia and low back pain.  She did  well with the trigger point injections that we had done a few months  ago.  She is having some more back pain again.  She had some recent  increase in overall pain with a cold that she contracted.  She used a  few extra oxycodone, but overall is returning with extra pill left in  the bottle.  The meds that she is on, ReQuip seemed to be working well  for sleep and energy.  She tries to stay active.  She uses TENS and  Alpha-Stim  particularly for back pain.  She rates her pain as 6-7/10.  Her Oswestry score is 52%.  Pain described as sharp, stabbing, aching,  and constant.  Sleep is fair to poor.  Pain interferes with the general  activity,  relationships with others, enjoyment of life on a moderate-to-  severe level.  She can walk about 15-20 minutes without having to stop.   REVIEW OF SYSTEMS:  Notable for the above as well as numbness, tremor,  tingling, spasms, depression, anxiety, nausea, vomiting, high sugars,  weight gain, and abdominal pain, and limb swelling.  Full review is in  the written health and history section.  Other pertinent positives are  above.   SOCIAL HISTORY:  The patient is married living with his spouse, and no  specific changes are noted there.   PHYSICAL EXAMINATION:  VITAL SIGNS:  Blood pressure is 118/72, pulse is  114, respiratory rate 18, and she is sating 97% on room air.  GENERAL:  The patient is pleasant, alert, and oriented x3.  Affect is  bright and appropriate.  She walks without any difficulty today.  She is  able to bent a farther today I felt proximally 50-60 degrees with  forward flexion at the low back.  She had some trigger points today and  thoraci paraspinals right more than left in fact along the T4, T8, as  well as the T6 and T7 levels on the left.  It seemed to center around  her scapulae particularly.  Strength is 5/5.  Sensory exam is intact.  She has trace distal  edema.  HEART:  Regular rate.  CHEST:  Clear.  ABDOMEN:  Soft and nontender.   ASSESSMENT:  1. Lumbar spondylosis with degenerative disk disease and facet      arthropathy.  She has done well with RFs.  2. Thoracic myofascial pain.  3. Fibromyalgia.  4. Migraine headaches.  5. Peripheral neuropathy.  6. Carpel tunnel syndrome.  7. Obesity.  8. Restless leg syndrome and insomnia.  9. Anxiety with depression.   PLAN:  1. Continue oxycodone 10 mg q.6 h. p.r.n.  2. I refilled Rozerem as she still has not received her 3 months      prescription 8 mg 2 tablets at bedtime #60.  3. Maintain ReQuip and Mestinon as previously dosed.  4. We injected 4 trigger points today along the thoracic paraspinals.      Encouraged stretching and exercise as well      as modalities as she is doing.  5. I will see her back in 3 months.      Ranelle Oyster, M.D.  Electronically Signed     ZTS/MedQ  D:  05/31/2008 09:54:16  T:  06/01/2008 01:48:14  Job #:  [id number]

## 2010-10-23 NOTE — Procedures (Signed)
[name], [name]                ACCOUNT NO.:  [id number]   MEDICAL RECORD NO.:  [id number]          PATIENT TYPE:  REC   LOCATION:  [facility]                          FACILITY:  [facility]   PHYSICIAN:  Erick Colace, M.D.DATE OF BIRTH:  July 22, 1955   DATE OF PROCEDURE:  02/02/2009  DATE OF DISCHARGE:                               OPERATIVE REPORT   PROCEDURE:  Right L5 dorsal ramus radiofrequency neurotomy, right L4  medial branch radiofrequency neurotomy, right L3 medial branch  radiofrequency neurotomy under fluoroscopic guidance.   Informed consent was obtained after describing the risks and benefits of  the procedure with the patient.  These include bleeding, bruising, and  infection.   INDICATIONS:  Decreased mobility due to back pain and pain is only  partially responsive to narcotic analgesic medications and other  conservative care.  She has had good and prolonged relief following  prior lumbar radiofrequency procedure performed March 08, 2008.   Informed consent was obtained after describing the risks and benefits of  the procedure with the patient.  These include bleeding, bruising,  infection.  She elects to proceed and has given written consent.  The  patient placed prone on fluoroscopy table.  Betadine prep, sterile  drape, 25-gauge inch and half needle was used to anesthetize the skin  and subcutaneous tissue 1% lidocaine x2 mL at each of 3 sites.  Then, a  20-gauge, 10-cm RF needle with 10-mm curved active tip was inserted  under fluoroscopic guidance starting at the right S1 SAP sacral ala  junction, bone contact made, confirmed with lateral imaging.  Sensory  stem at 50 Hz followed by motor stem at 2 Hz confirmed proper needle  location followed by injection of 1 mL of a solution containing 1 mL of  4 mg/mL dexamethasone and 2 mL of 1% MPF lidocaine followed by  radiofrequency lesioning 70 degrees Celsius for 70 seconds and the right  L5 SAP transverse process  junction targeted, bone contact made,  confirmed with lateral imaging.  Sensory stem at 50 Hz followed by motor  stem at 2 Hz confirmed proper needle location followed by injection of 1  mL of the dexamethasone lidocaine solution and radiofrequency lesioning  70 degrees Celsius for 70 seconds.  Then, the right L4 SAP transverse  junction targeted, bone contact made, confirmed with lateral imaging.  Sensory stem at 50 Hz followed by motor stem at 2 Hz confirmed proper  needle location followed by injection 1 mL of dexamethasone lidocaine  solution and radiofrequency lesioning 70 degrees Celsius for 70 seconds.  The patient tolerated the procedure well.  Pre and post injection vitals  stable.  Post injection instructions given.      Erick Colace, M.D.  Electronically Signed     AEK/MEDQ  D:  02/02/2009 10:59:04  T:  02/03/2009 00:45:29  Job:  [id number]

## 2010-10-23 NOTE — Assessment & Plan Note (Signed)
[name] is back regarding her fibromyalgia and back pain.  She states  that she has been feeling a bit worse over the last couple of days.  She  is having some spasms between the shoulders.  We had an MRI of her  thoracic spine done and it is only remarkable for a T10-T11 broad-based  disk protrusion and a smaller one to the right at T9-T10.  Nothing  proximally was notable for any disease.  She had some benefit with the  trigger point injections and the thoracic paraspinals.  She did very  well with the left shoulder intra-articular injection.  She is  interested in trying a muscle relaxer.  She continues on the Mestinon  and Requip as well.  She is finishing up therapies.  She is still  working on her weight and has lost 15 pounds up to this point.  She is  also on Topamax, Rozerem and Trazodone.   The patient rates her pain today at a 7 to 8/10 describing it as sharp,  stabbing and constant.  The pain interferes with quality of life,  relations with others and general activity on a moderate level.  Sleep  is poor at times.   REVIEW OF SYSTEMS:  Notable for the above as well as anxiety,  depression, skin rash, limb swelling, occasional shortness of breath.  She has some night sweats as well.  Other pertinent problems are as  above and in the written health and history section.   SOCIAL HISTORY:  The patient is married.  No changes noted.   PHYSICAL EXAMINATION:  VITAL SIGNS:  Blood pressure is 157/77, pulse of  78, respiratory rate 20, she is satting 96% on room air.  GENERAL:  The patient is pleasant, alert and oriented x3.   Affect is bright and appropriate.  Gait is stable.  She had some initial  problems when she went from a sitting to standing position with her back  pain but overall posture was unchanged.  She tends to have a level of  scoliosis in the  _______ lumbar to low thoracic spine with overall  clockwise rotation of the spine, particularly in the lumbar area.  She  can improve her posture with cuing. Strength is generally 5/5.  Sensory  exam is intact.  The pain seems to be the most prominent on the mid to  upper thoracic segment.  The patient is also somewhat tender in the  midlumbar areas.  Her pain worsens with flexion and extension today.   HEART:  Regular.  CHEST:  Clear.  ABDOMEN:  Soft and nontender.  NEUROLOGIC:  I saw no motor or sensory problems.  Cranial nerve exam was  intact and cognition was appropriate.   ASSESSMENT:  1. Lumbar spondylosis at L5-S1 and also thoracic disk disease at T9-10      and T10-T11.  2. Postural back pain, particularly in the mid thoracic spine.  3. Fibromyalgia syndrome.  4. Migraine headaches.  5. Lumbar facet arthropathy.  6. Peripheral neuropathy.  7. Obesity.  8. Left rotator cuff syndrome.  9. Restless leg syndrome.  10.Insomnia.  11.Tobacco abuse.  12.Anxiety and depression.   PLAN:  1. Add Flexeril 5 mg every eight hours as needed for breakthrough      spasms.  2. Continue oxycodone for breakthrough pain 5 mg one every six hours      as needed, #90.  3. Consider lumbar epidural injection versus facet blocks.  4. Encourage ongoing weight loss before  we seek more interventional      measures.  5. Maintain medications at current doses including Rozerem, Requip,      Topamax, and Trazodone.  6. Defer further trigger point shots today.  7. I will see her back in about two months with nurse clinic follow-up      in one month's time.      Ranelle Oyster, M.D.  Electronically Signed     ZTS/MedQ  D:  10/12/2007 13:55:10  T:  10/12/2007 14:15:26  Job #:  [id number]   cc:   Franchot Heidelberg, M.D.

## 2010-10-23 NOTE — Procedures (Signed)
NAMEKENNEDY, BRINES                ACCOUNT NO.:  [id number]   MEDICAL RECORD NO.:  [id number]          PATIENT TYPE:  REC   LOCATION:  [facility]                          FACILITY:  [facility]   PHYSICIAN:  Erick Colace, M.D.DATE OF BIRTH:  12/27/1955   DATE OF PROCEDURE:  DATE OF DISCHARGE:                               OPERATIVE REPORT   HISTORY OF PRESENT ILLNESS:  Ms. Reta returns today, a 55 year old  female, with lumbosacral radiculitis neuritis due to lumbar degenerative  disk spondylosis.  Pain is only partial response to medication  management.  Her  pain interferes with her mobility and self-care.   PROCEDURE:  Informed consent was obtained after describing risks and  benefits of the procedure to the patient that include bleeding,  bruising, infection, loss of bladder function, temporary or permanent  paralysis.  She elects to proceed and has given written consent. The  patient was placed prone on fluoroscopy table.  Betadine prep, sterile  drape,  5-gauge, 1-1/2 inch needle was used to incise skin and subcu  tissue with 1% lidocaine x2 cc's then an 18 gauge Houston needle was  inserted, right paramedian approach, bone contact made and needle  redirected inferiorly, loss resistance technique employed with 50%  sterile saline air and the air solution then 180 under live fluoro 1 cc  demonstrated good epidural spread followed by injection of 2 cc of 40  mg/cc Depo-Medrol and 2 cc of 1% preservative-free lidocaine.  The  patient tolerated procedure well.   FOLLOW UP:  Return in 1 month.      Erick Colace, M.D.  Electronically Signed     AEK/MEDQ  D:  06/29/2007 15:08:49  T:  06/30/2007 03:59:37  Job:  [id number]   cc:   Franchot Heidelberg, M.D.

## 2010-10-23 NOTE — Procedures (Signed)
NAMEJARYAH, [name]                ACCOUNT NO.:  [id number]   MEDICAL RECORD NO.:  [id number]          PATIENT TYPE:  REC   LOCATION:  [facility]                          FACILITY:  [facility]   PHYSICIAN:  Erick Colace, M.D.DATE OF BIRTH:  [birth year]   DATE OF PROCEDURE:  DATE OF DISCHARGE:                               OPERATIVE REPORT   PROCEDURE:  L5-S1 translaminar lumbar epidural steroid injection under  fluoroscopic guidance.   INDICATIONS:  Lumbar pain with lower extremity radicular symptoms.  Positive MRI showing disk degeneration L5-S1 with protrusion at that  level and mild facet degeneration at that level.   DESCRIPTION OF PROCEDURE:  Informed consent was obtained after  describing risks and benefits of the procedure with the patient.  These  include bleeding, bruising, infection, loss of bowel or bladder  function, temporary or permanent paralysis.  She elects to proceed and  has given written consent.  The patient placed prone on fluoroscopy  table.  Betadine prep, sterile drape 25-gauge inch and half needle was  used to anesthetize skin and subcu tissue with 1% lidocaine x2 ccs.  Then 18-gauge Houston needle was inserted first at the right paramedian  approach.  There was difficulty with advancement into the bony contact  and no good loss-of-resistance was obtained.  Therefore, left side was  performed.  APPROPRIATE, lateral and oblique imaging targeted the  inferior lamina of L5, bone contact made needle redirected inferiorly.  Loss of resistance obtained.  Omnipaque 180 under live fluoro  demonstrated good epidural spread. No evidence of vascular uptake no  evidence of vascular uptake followed by injection of 2 of 40 mg/cc Depo-  Medrol and 2 mL of 1% MPF lidocaine.  The patient tolerated procedure  well.  Pre injection pain level 9 out of 10 and post injection 3 out of  10. She will see Dr. Riley Kill in the unit in 2 weeks to assess efficacy.  Post injection instructions  given.  Pre post injection vitals stable.      Erick Colace, M.D.  Electronically Signed     AEK/MEDQ  D:  06/02/2007 14:12:41  T:  06/03/2007 03:21:48  Job:  [id number]

## 2010-10-23 NOTE — Procedures (Signed)
NAMESABRENA, [name]                ACCOUNT NO.:  [id number]   MEDICAL RECORD NO.:  [id number]          PATIENT TYPE:  OUT   LOCATION:  [facility]                           FACILITY:  [facility]   PHYSICIAN:  Dani Gobble, MD       DATE OF BIRTH:  February 15, 1956   DATE OF PROCEDURE:  DATE OF DISCHARGE:                                ECHOCARDIOGRAM   INDICATIONS:  A 55 year old female with past medical history of  diabetes, hypertension who was referred for dyspnea.   Technical quality of this study is reasonable.   The aorta measures normally at 2.5 cm.   The left atrium appears normal in size.  The patient appeared to be in  sinus rhythm during procedure.  No obvious masses or clots were noted.   The interventricular septum and posterior wall were borderline  hypertrophied.   The aortic valve leaflets were not well visualized.  Overall,  valvular  opening appeared to be reasonable.  Again, the leaflets themselves were  not well visualized.  No definitive aortic insufficiency was  appreciated.  However, Doppler interrogation of the aortic valve  revealed a peak velocity of 2.1 meters per second corresponding to a  peak gradient of 17 mmHg and a mean gradient of 11 mmHg.   The mitral valve was grossly normal.  No mitral valve prolapse was  noted.  Trivial mitral regurgitation was noted.  Doppler interrogation  of mitral valve was within normal limits.   Pulmonic valve was not well visualized.   Tricuspid valve also appeared grossly structurally normal with mild  tricuspid regurgitation appreciated as an eccentric jet along the inner  atrial septum.  However, the possibility of a PFO or ASD could not be  entirely excluded on this study.  Again, this may well have been due to  eccentric TR.   The left ventricle was normal in size with LV IDD measured at 3.6 cm and  LV IC measured at 2.4 cm.  Overall, left ventricular systolic function  was normal and no regional wall motion abnormalities were  noted.   The right atrium was normal in size while the right ventricle may have  been just mildly dilated with normal right ventricular systolic  function.   No pericardial effusion was appreciated.   IMPRESSION:  1. Borderline concentric LVH.  2. Although the aortic valve leaflets were not well visualized.  I did      not see a marked thickening of the leaflets, what few glimpses I      got of the leaflets themselves.  Doppler interrogation aortic valve      does suggest elevated velocities and mean gradient suggestive of      mild aortic stenosis.  This is also appreciated on color-flow      Doppler with turbulence.  I did not see a definitive membrane that      could have been causing this.  In one view only, there was a subtle      suggestion of systolic anterior motion of the mitral valve which      certainly could  be contributing, but I am not convinced of this on      this study.  3. Trivial mitral and tricuspid regurgitation.  4. Possibility of systolic and anterior motion of mitral valve cannot      be excluded on this study.  5. Normal left size and systolic function without regional wall motion      abnormality noted.  6. Possibly mild right ventricular enlargement with normal left      systolic function  7. Consider transesophageal echocardiogram if clinically indicated for      enhanced delineation of the cardiac structures.           ______________________________  Dani Gobble, MD     AB/MEDQ  D:  02/10/2007  T:  02/10/2007  Job:  [id number]   cc:   Franchot Heidelberg, M.D.

## 2010-10-23 NOTE — Procedures (Signed)
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   DESCRIPTION OF THE PROCEDURE:  After informed consent, preparation of  the skin with Betadine, we injected via lateral approach the left knee  using 40 mg Kenalog and 3 mL of 1% lidocaine.  The patient tolerated the  injection without issue.  Aspiration technique was utilized.  No  bleeding was experienced.   1. I will refill the patient's oxycodone 5 mg #180.  We discussed      about reducing the amount of this being used.  We may consider      switching over to Ultram for pain control.  2. I will see her back in about 3 months' time with 33-month follow up      in the [facility].      Ranelle Oyster, M.D.  Electronically Signed     ZTS/MEDQ  D:  09/07/2008 13:06:44  T:  09/08/2008 00:16:07  Job:  [id number]

## 2010-10-23 NOTE — Assessment & Plan Note (Signed)
HISTORY:  [name] is back regarding her fibromyalgia and back pain.  She  had a fall a few weeks ago when she stood and her back suddenly locked  up in pain.  She was sitting in the bed initially.  She had had carpal  tunnel surgery in the left hand on December 23, 2007, and reinjured the hand  and tore open some stitches.  She also sprained her left ankle.  Essentially, since then, her pain has been increased.  She has been more  depressed, and overall her fibromyalgia has been bothering her more.  She states her pain ranges from 5-9/10.  She describes it is sharp,  burning, stabbing, aching, and constant.  Back seems to bother her  specifically over the lower lumbar segments without severe radiation.  She uses oxycodone 5 mg 1 q.6 h. p.r.n. without much benefit at this  point.  Sleep has been poor.   REVIEW OF SYSTEMS:  Notable for numbness, tremor, tingling, weakness,  trouble walking, spasm, dizziness, confusion, depression, anxiety, loss  of taste, suicidal thoughts, fever, skin rash, poor appetite, and limb  swelling.   SOCIAL HISTORY:  The patient is married, living with her spouse.  She is  still smoking 1 pack per day, and actually this has increased since I  last saw her.   PHYSICAL EXAMINATION:  VITAL SIGNS:  Blood pressure 148/73, pulse 89,  respiratory rate 24, and she is sating 97% on room air.  GENERAL:  The patient is pleasant, alert, and oriented x3, but is  anxious and became tearful at times when discussing her issues.  MUSCULOSKELETAL:  Low back is tender to palpation over the lower lumbar  spine segment, particularly from L4 through S1 bilaterally.  The  extension and flexion seem to worsen pain today.  She was a bit worse  with extension and facet maneuvers.  Strength was generally 5/5 with  intact sensory exam in the lower extremities.  The left wrist is in a  splint with sutures in place.  I examined the area, which was reinjured  from the fall.  Left ankle was  slightly swollen, and she is in a Darco  boot.  She remains overweight.  HEART:  Regular.  CHEST:  Clear.  ABDOMEN:  Soft and nontender.   ASSESSMENT:  1. Lumbar spondylosis at L5-S1 with notable disk disease and facet      arthropathy.  We tried epidural injections with equivocal results.  2. Postural thoracic pain.  3. Fibromyalgia.  4. Migraine headaches.  5. Peripheral neuropathy.  6. Obesity.  7. Bilateral carpal tunnel syndrome.  8. Restless leg syndrome.  9. Insomnia.  10.Anxiety and depression.  11.Tobacco abuse.   PLAN:  1. We will increase oxycodone to 10 mg q.6 h. p.r.n., #90.  2. I would like the patient to begin the trial of Savella to replace      Cymbalta.  We need to check with her psychiatrist, Dr. Duayne Cal, first      for making that change.  3. We will send the patient to Dr. Wynn Banker for medial branch block      at L4-L5 and L5-S1.  4. Continue with stretching range of motion and sensible activities.      She needs to work on weight loss as well.  5. I will see her back pending the above.      Ranelle Oyster, M.D.  Electronically Signed     ZTS/MedQ  D:  01/04/2008 12:24:18  T:  01/05/2008 02:38:23  Job #:  [id number]   cc:   Franchot Heidelberg, M.D.

## 2010-10-23 NOTE — Assessment & Plan Note (Signed)
[name] is back regarding her multiple pain complaints.  She has had  good results thus far with the increase in Neurontin and addition of  Requip.  Lumbar epidural injection helped for a few days and therapy  seems to be helping her low back.  [facility] was unable to provide  some of the holistic therapy we were looking for, for fibromyalgia, but  they have been working on her back and core muscles.  Her pain is 6/10  and describes it as stabbing, constant aching and sometimes sharp and  sometimes dull.  The pain interferes with her general activity,  relations with others, and enjoyment of life on a moderate to severe  level.  Patient states that she can walk about 10-15 minutes at a time  without having to stop.  Her foot pain is no longer knife-like but still  is a bit tingling at times.   REVIEW OF SYMPTOMS:  Notable for multiple issues.  She still has some  fatigue during the day.  The Concerta has not helped dramatically along  those lines.  She had some intermittent nausea and vomiting and GI  symptoms.  Full review is in the written medical history section in the  chart.   SOCIAL HISTORY:  Patient is married and living with her spouse.  No  other changes are noted there.   PHYSICAL EXAMINATION:  VITAL SIGNS:  Blood pressure is stable.  She is  afebrile.  GENERAL:  She is alert and oriented x3.  Affect is bright and  appropriate.  Patient bent and touched her toes today with only minimal  discomfort.  She rotated and lateral bends to either side with some  pain.  No focal motor or sensory abnormalities were appreciated today.  She did have some generalized pain and tenderness throughout the limbs,  trunk, and neck.  Coordination was fair.  Reflexes are 2+.  Cognitively,  she is appropriate.  Cranial nerve exam was intact.  HEART:  Regular.  CHEST:  Clear.  ABDOMEN:  Soft and nontender.   ASSESSMENT:  1. Multi-level lumbar spondylosis, most pronounced at L5-S1 and  also      at T10-T11.  2. Lumbar facet arthropathy.  3. Fibromyalgia syndrome.  4. Migraine headaches.  5. Peripheral neuropathy.  6. Morbid obesity.  7. Left rotator cuff syndrome.  8. Restless legs.  9. Insomnia.  10.Tobacco abuse.  11.Anxiety with depression.   PLAN:  1. Will stop Concerta for now and give her a trial of Mestinon to      improve the fatigue and improve muscle endurance/pain.  2. Continue Neurontin at 600 mg t.i.d.  3. Will order a followup lumbar injection at L5-S1 per Dr. Wynn Banker.  4. Continue working with [facility] for core muscle strengthening      and stabilization and eventual home exercise program.  5. Give trial of Rozerem 8 mg nightly to replace Lunesta.  Patient was      provided samples today.  6. Continue Requip 2 mg at night and 1 mg in the morning.  7. Continue trazodone and Topamax.  8. I will see her back in about 6 weeks or so.  I also refilled      oxycodone p.r.n. today.      Ranelle Oyster, M.D.  Electronically Signed     ZTS/MedQ  D:  06/16/2007 13:11:33  T:  06/16/2007 13:48:00  Job #:  [id number]   cc:   Franchot Heidelberg, M.D.

## 2010-10-23 NOTE — Assessment & Plan Note (Signed)
[name] is back regarding her fibromyalgia and low back pain.  She has  done fairly well with the low back after the RFs.  She is having some  more leg pain currently.  Seems to come from her back but also emanates  from her right knee.  She places her left knee symptoms to a lesser  extent.  She wanted some alternatives to her Rozerem, as she does think  that she will have been to have this covered any further as she has  changed to a Medicare advanced plan.  The patient's pain is 8-9/10  today.  She is at 58% on her Oswestry score today.  Pain is described as  sharp, burning, stabbing, tingling, and aching.  Pain interferes with  general activity, relations with others, and enjoyment of life on a  moderate-to-severe level.  Sleep is poor at times, although improved as  a whole.  Pain worsens with walking, bending, and activity, standing,  and better with heat, as well as her TENS injections and medications.  She uses oxycodone 10 mg q.6 h p.r.n. currently for breakthrough pain.   REVIEW OF SYSTEMS:  Notable for tremor, trouble walking, spasms,  dizziness, confusion, depression, anxiety, loss of taste, suicidal  thoughts in the past, high sugars, irritable bowel syndrome, and night  sweats.  Full review is in the written health and history section.   SOCIAL HISTORY:  The patient is married and lives with her spouse.  No  other changes noted today.  She does report that she stopped smoking,  however.   PHYSICAL EXAMINATION:  Blood pressure 137/57, pulse is 96, respiratory  rate 18.  She is sating 98% on room air.  The patient is pleasant, alert  and oriented x3.  She has definite antalgia to the right with valgus  deformity at both knees.  She is able to bend to about 60 degrees of  flexion with pain sitting in.  Extension causes more pain once again.  She has some pain in the thoracic region again.  She remains overweight.  Strength is 5/5.  Normal sensory exam.  Right knee is notable  for  meniscal signs laterally and some crepitus with flexion and extension.  Strength is generally preserved there.  She has trace distal edema.  Heart is regular.  Chest is clear.  Abdomen is soft, nontender.  Cognitively, she is appropriate.   ASSESSMENT:  1. Lumbar spondylosis with degenerative disk disease and facet      arthropathy.  2. Thoracic myofascial pain.  3. Fibromyalgia.  4. Osteoarthritis of the bilateral knees, right greater than left.  5. Peripheral neuropathy.  6. Migraine headaches.  7. Carpal tunnel syndrome.  8. Obesity.  9. Restless leg syndrome with insomnia.  10.Anxiety/depression.   PLAN:  1. Continue oxycodone 10 mg q.6 h p.r.n..  I told her that I would      like to wean this down further.  2. After informed consent, we injected the right knee via medial      approach with 40 mg of Kenalog and 3 mL of 1% lidocaine.  The      patient tolerated it well.  3. Continue Rozerem for now for sleep.  May need to look at other      options depending on her insurance.  Recommend over-the-counter      melatonin if needed.  4. Refilled Mestinon 60 mg b.i.d. and Flexeril 5 mg q.8 h p.r.n.  5. Encouraged activity of an aerobic  nature and weight loss.  6. We will send her to [facility] for right knee brace for      support.      Ranelle Oyster, M.D.  Electronically Signed     ZTS/MedQ  D:  08/12/2008 11:24:15  T:  08/13/2008 02:19:50  Job #:  [id number]

## 2010-10-23 NOTE — Assessment & Plan Note (Signed)
[name] is back regarding her multiple pain complaints.  I last saw her  on March 31 where we performed a right knee injection.  She states that  this helped for a couple of months.  She has had some increased pain  over the last month or so.  She is also complaining of trigger point  over the right shoulder blade.  She complains additionally of ongoing  problems with sleep despite being on Rozerem.  She is also having  headaches.  She was diagnosed with cirrhosis apparently.  She is seeing  Dr. Marina Goodell in this regard.  He stopped her Lipitor.  The patient's pain  is 6/10, but may range to 10/10.  Pain is sharp, burning, stabbing, and  aching.  Pain interferes with general activity, relations with others,  enjoyment of life on an 8-10/10 level.  Sleep is poor.   REVIEW OF SYSTEMS:  Notable for multiple items and these are all listed  in the written health and history section of our chart.   SOCIAL HISTORY:  The patient is married, living with her spouse.  She is  still smoking after quitting the habit recently.   PHYSICAL EXAMINATION:  Blood pressure is 147/85, pulse is 114,  respiratory rate 18, she is sating 97% on room air.  The patient is  generally pleasant, alert and oriented x3.  Affect is bright and  appropriate.  She walks with some antalgia favoring the right more than  the left leg today.  There is no significant crepitus of both knees.  There is no frank instability seen.  Meniscal maneuvers were positive on  either side.  Strength was near 5/5 in both legs with 1+ reflexes.  No  focal or sensory deficits were appreciated today.  She had a normal  cranial nerve exam.  Cognitively, she is intact.  She remains  overweight.  She had pain with palpation over the mid thoracic spine and  over the rhomboids on the right, particularly just underneath the medial  scapular border.  Heart was tachycardic.  Chest was clear.  Abdomen is  soft, nontender.  She has some trace edema in both  legs still.   ASSESSMENT:  1. Lumbar spondylosis with degenerative disk disease and facet      arthropathy.  2. Thoracic myofascial pain.  3. Fibromyalgia.  4. Osteoarthritis, bilateral knees.  5. Peripheral neuropathy.  6. Migraine headaches.  7. Carpal tunnel syndrome.  8. Obesity.  9. Restless leg syndrome and insomnia.  10.Anxiety with depression.   PLAN:  1. We will try adjusting her trazodone up to 200 mg nightly.  I asked      her to check with her psychiatrist first.  We will wean Rozerem off      over the next 2 weeks' time as we are not sure this will extremely      help.  2. Continue ReQuip 3 mg nightly for restless leg symptoms as this      seems to have helped quite a bed.  3. After informed consent, I injected the right knee via lateral      approach with 40 mg Kenalog and 3 mL of 1% lidocaine.  The patient      tolerated well.  Additionally, we injected the right middle      rhomboid with a 2 mL trigger point injection underneath the medial      scapular border.  The patient tolerated well.  4. We will send the patient for  sleep study as she is having some      continued symptoms of sleep apnea with difficulty breathing at      night, snoring, vivid dreams, etc.  Certainly, her pain plays a      role in her sleep as well.  She is going to see Dr. Vickey Huger to      also discuss sleep and her headache management.  5. Hepatitis.  Follow up per Dr. Marina Goodell.  I see nothing that we need      to acutely change from a pain medication standpoint.  6. Mestinon 60 mg b.i.d. as well as oxycodone 5 mg 1-2 q.6 h. p.r.n.      #90.  7. We will set the patient up with medial branch blocks, lumbar spine      with Dr. Wynn Banker other than the appointments available.      Ranelle Oyster, M.D.  Electronically Signed     ZTS/MedQ  D:  12/28/2008 14:23:48  T:  12/29/2008 05:06:52  Job #:  [id number]

## 2010-10-23 NOTE — Assessment & Plan Note (Signed)
[name] is back regarding her multiple pain complaints.  She had great  results with the L5-S1 translaminar injections by Dr. Wynn Banker.  Her  back pain is 60% improved still as of today.  She is having some more  pain in her left shoulder in between the scapula.  She states that last  week after therapy she seemed to aggravate the upper back region.  On  her lumbar MRI we did last year there was note of T10-T11 disk  herniation, and we did not have any other images of more superior  segments.  She uses her oxycodone for breakthrough pain, but is using  less of that.  She is on Neurontin as well as Rozerem.  The Rozerem  seems to be helping sleep in addition to her Requip.  She states her  pain will range from a 2 to 8 out of 10, depending, usually, on the  level of activity.  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   The patient describes her pain as sharp, stabbing, dull, and aching at  times.  Sleep is fair.  Pain interferes with general activity, relations  with others, and enjoyment of life on a moderate level.   REVIEW OF SYSTEMS:  Notable for the above as well as some tremor and  tingling, depression, anxiety, occasional confusion, weight gain, limb  swelling.  Full review is in the written health and history section of  the chart.   SOCIAL HISTORY:  The patient is married and living with her husband.   PHYSICAL EXAMINATION:  Blood pressure is 131/73, pulse is 79,  respiratory rate 18, she is satting 98% on room air.  The patient is pleasant, alert and oriented x3.  Affect is bright and  appropriate.  She had minimal back pain today with range of motion and flexion and  extension.  Her most significant areas of pain were the mid to upper  thoracic segments, particularly around T4-T5, T5-T6.  She has some pain  along the paraspinals, but more directly over the spinous  processes in  these levels.  Pain is a bit worse with bending and twisting of the  thorax.  Left shoulder is notable for positive rotator cuff signs once  again today.  Strength was fair in the left shoulder.  HEART:  Regular.  CHEST:  Clear.  ABDOMEN:  Soft, nontender.  Sensory exam is grossly intact.  Cranial nerve exam is normal.  Cognitively she was appropriate.   ASSESSMENT:  1. Multilevel lumbar spondylosis at L5-S1 and also T10-T11.  2. Thoracic spine pain.  Need to rule out thoracic spondylosis.  3. Fibromyalgia syndrome.  4. Migraine headaches.  5. Lumbar facet arthropathy.  6. Peripheral neuropathy.  7. Obesity.  8. Left rotator cuff syndrome.  9. Restless leg syndrome.  10.Insomnia.  11.Tobacco abuse.  12.Anxiety with depression.   PLAN:  1. Continue Mestinon as she has had good results with this for energy      during the day.  2. Refill oxycodone 5 mg #90, although her need for this seems to be      decreasing.  3. Encouraged ongoing therapy at this point.  4. After informed consent, we injected the left shoulder via posterior      approach with 40 mg of Kenalog and 3 cc 1% lidocaine.  The patient      tolerated well.  Next  we injected around the      intraspinous/paraspinal regions of T4-T5, T5-T6 with 2 cc 1%      lidocaine injections.  The patient tolerated well.  5. Maintain Rozerem and Requip at night.  6. Continue Topamax at increased dose of 200 mg.  7. Continue trazodone.  8. I will see her back in about 1 month.  Encouraged ongoing      appropriate diet, exercise, and smoking cessation.  9. I ordered an MRI of the T-spine to assess for disc pathology in the      upper t-spine and to f/u the lower disc disease seen at t10-t11 on      prior lumbar exam.      Ranelle Oyster, M.D.  Electronically Signed     ZTS/MedQ  D:  08/25/2007 09:47:32  T:  08/25/2007 10:35:06  Job #:  [id number]   cc:   Franchot Heidelberg, M.D.

## 2010-10-23 NOTE — Assessment & Plan Note (Signed)
[name] is back regarding her multiple pain complaints.  We had an MRI  of her lumbar spine performed, and most notable were broad-sized disk  herniation at T10-11 with disk degeneration of L5-S1.  She has multiple  levels of disk bulging and facet degeneration throughout the lumbar  spine.  Her low back seems to be the most tender just above the pelvis  as well as the area between her scapulas.  She felt the Requip helped a  bit with her generalized pain, but she is still having problems with  sleep.  Neurontin has helped with pain in her legs, but she found that  she was falling asleep during the day.  She still uses Topamax for  headache control.  She is on Skelaxin for muscle relaxing benefits.  She  takes trazodone.  Alfonso Patten is now scheduled at night and seems to help  somewhat with her sleep.  She is on multiple other medications for other  diagnoses, including Abilify, Cymbalta, and Fioricet for breakthrough  pain.  She continues with psychiatric followup, but I am not clear how  regular this is.  She describes her pain as sharp, burning, stabbing,  aching, constant.  She has had a fall a few days ago, and she apparently  has had some more shoulder pain since then on the left.  She states that  she has had shoulder pain off and on.  Therapy has backed off work a bit  with her due to the shoulder pain.  She has problems lifting the arm  away from her side.   REVIEW OF SYSTEMS:  Notable for numbness, tremor, tingling, weakness,  spasms, dizziness, confusion, depression, anxiety, loss of taste, fever,  weight gain, night sweats, high sugars, diarrhea, nausea, abdominal  pain, limb swelling, coughing.  Full review is in the written health and  history section of the chart.   SOCIAL HISTORY:  Without change.   PHYSICAL EXAMINATION:  Blood pressure is 139/56, pulse 88, respiratory  rate 18.  Satting 98% on room air.  Patient is pleasant, alert and oriented x3.  Affect is bright and  appropriate.  Gait is stable today, but she tends to walk with a bit of a limb towards  her right.  The left shoulder is painful with abduction as well as  rotation on the new version of impingement maneuvers today.  She has  multiple areas of tenderness throughout the neck, back, shoulders, legs,  feet, etc.  She had pain particularly at the L5-S1 region.  I did not  notice as much pain in the upper lumbar spine or low thoracic areas.  No  focal motor or sensory losses appreciated in the legs.  Facet maneuvers  were equivocal to positive.  She had pain more so with flexion today.  She remains obese.  She remains distracted in general, as far as her  cognition is concerned, but is able to remember and display awareness  over situations.   ASSESSMENT:  1. Multilevel lumbar spondylosis with disk disease, most prominent in      L5-S1 as well as T10-11.  Patient with multilevel facet      arthropathy.  2. Fibromyalgia syndrome.  3. Migraine headaches.  4. Peripheral neuropathy.  5. Morbid obesity.  6. Left rotator cuff syndrome.  7. Restless legs.  8. Insomnia.  9. Tobacco abuse.  10.Anxiety with depression.   PLAN:  1. Will begin patient on a trial of Concerta 18 mg p.o. daily to  improve attention and arousal.  2. Stop Skelaxin.  3. Continue Neurontin at current dosing, as this has helped      paresthesias.  4. After informed consent, we inject the left shoulder via the      posterior approach at 40 mg of Kenalog and 3 cc of 1% lidocaine.  5. We will set up patient for L5-S1 translaminar injection per Dr.      Wynn Banker as a starting point.  We will see where we need to go      from there.  6. Continue with appropriate exercise and range of motion as we      describe as well as her work with therapy.  7. Continue Requip 2 mg at night, 1-2 mg in the a.m.  May need to look      at decreasing morning dosing due to lethargy but hopefully  her      Concerta will help with  this.  8. Continue trazodone and Topamax at bedtime.  9. I will see her back in about one month or so.      Ranelle Oyster, M.D.  Electronically Signed     ZTS/MedQ  D:  05/19/2007 13:37:27  T:  05/19/2007 17:13:42  Job #:  [id number]   cc:   Franchot Heidelberg, M.D.

## 2010-10-23 NOTE — Procedures (Signed)
NAME:  [name], [name]                ACCOUNT NO.:  [id number]   MEDICAL RECORD NO.:  [id number]          PATIENT TYPE:  REC   LOCATION:  [facility]                          FACILITY:  [facility]   PHYSICIAN:  Erick Colace, M.D.DATE OF BIRTH:  1956/04/01   DATE OF PROCEDURE:  03/03/2008  DATE OF DISCHARGE:                               OPERATIVE REPORT   PROCEDURE:  Bilateral L5 dorsal ramus injection, bilateral L4 medial  branch block, and bilateral L3 medial branch block under fluoroscopic  guidance.   INDICATIONS:  Lumbar spondylosis without myelopathy.  She had greater  than 50% relief of pain following last set of medial branch blocks where  her pain levels went from 7/10 pre-injection and 3/10 post-injection  performed on January 18, 2008.   Pain is only partially response to medication management and other  conservative care, and interferes with self-care mobility and persists  despite narcotic analgesic medications.   Informed consent was obtained after describing the risks and benefits of  the procedure with the patient.  These include bleeding, bruising, and  infection.  She elects to proceed and has given written consent.  The  patient was placed prone on fluoroscopy table.  Betadine prep, and  sterile draped.  A 25-gauge, 1-1/2-inch needle was used to anesthetize  the skin and subcutaneous tissue with 1% lidocaine x2 mL.  Then, a 22  gauge, 3-1/2-inch spinal needle was inserted under fluoroscopic  guidance.  AP, lateral, and oblique imaging utilized.  Omnipaque 180 x  0.5 mL demonstrated no intravascular uptake after targeting the left S1  SAP sacral ala junction.  Then a solution containing a 1 mL of 4 mg/cc  dexamethasone and 2 mL of 2% MPF lidocaine was injected x 0.5 mL.  Then  the left L5 SAP transverse process junction targeted bone contact made  confirmed with lateral imaging.  Omnipaque 180 x 0.5 mL demonstrated no  intravascular uptake and then 0.5 mL of  dexamethasone-lidocaine solution  was injected.  Then the left L4 SAP transverse junction targeted bone  contact made.  Omnipaque 180 x 0.5 mL demonstrated no intravascular  uptake, then 0.5 mL dexamethasone-lidocaine solution was injected.  Same  procedure was repeated on the right side at corresponding levels using  the same needle injectate and technique.  The patient tolerated the  procedure well.  Pre and post-injection and vitals stable.  Post-  injection instructions were given.  Pre-injection pain level 8/10.  Post-  injection pain level of 4-5/10.  She would be a good candidate for  radiofrequency neurotomy, we will schedule.      Erick Colace, M.D.  Electronically Signed     AEK/MEDQ  D:  03/03/2008 09:25:27  T:  03/03/2008 10:07:57  Job:  [id number]

## 2010-10-23 NOTE — Letter (Signed)
April 18, 2009     RE:  [name], [name]  MRN:  [id number]  /  DOB:  July 14, 1955   To whom it may concern:   This 55 year old woman has documented obstructive sleep apnea based on  nocturnal polysomnogram with a respiratory disturbance index (RDI) of 13  per hour, moderate snoring and witnessed apnea with daytime fatigue.  CPAP therapy has given symptomatic and objective improvement in the past  and needs to be restarted based on her medical condition.    Sincerely,      Clinton D. Maple Hudson, MD, Tonny Bollman, FACP  Electronically Signed    CDY/MedQ  DD: 04/18/2009  DT: 04/19/2009  Job #: [id number]

## 2010-10-26 NOTE — Op Note (Signed)
[name], [name]                ACCOUNT NO.:  [id number]   MEDICAL RECORD NO.:  [id number]          PATIENT TYPE:  AMB   LOCATION:  DAY                           FACILITY:  [facility]   PHYSICIAN:  Kassie Mends, M.D.      DATE OF BIRTH:  [birth year]   DATE OF PROCEDURE:  09/19/2006  DATE OF DISCHARGE:                               OPERATIVE REPORT   REFERRING PHYSICIAN:  Franchot Heidelberg, M.D.   PROCEDURE:  Colonoscopy with cold snare, snare cautery, and cold forceps  polypectomy.   INDICATION FOR EXAM:  [name] is a 55 year old female who presents for  colon cancer screening.  She is unsure her family history of colon  cancer or colon polyps.   FINDINGS:  Multiple colon polyps seen in the ascending colon and sigmoid  colon as well as the rectum.  Her polyps were removed via cold snare,  snare cautery, and cold forceps.  Otherwise no masses, inflammatory  changes, diverticula or AVMs seen.  No internal hemorrhoids.   RECOMMENDATIONS:  1. Will call [name] with her biopsy report.  2. Screening colonoscopy in 3 years.  3. High-fiber diet.  She is instructed to begin a high-fiber diet      beginning Friday, April 18,2008.  She is asked to begin high-fiber      diet on the next Friday when her colon heals from her      polypectomies.  4. No aspirin, anti-inflammatory drugs or anticoagulation for 7 days.  5. She is given information on high-fiber diet and polyps.  6. Follow up with Franchot Heidelberg.   MEDICATIONS:  1. Demerol 75 mg IV.  2. Versed 5 mg IV.  3. Phenergan 25 mg IV.   PROCEDURE TECHNIQUE:  Physical exam was performed and informed consent  was obtained from the patient explaining this risks, benefits and  alternatives to the procedure.  The patient was connected to the monitor  and placed in left lateral position.  Continuous oxygen was provided by  nasal cannula and IV medicine administered through an indwelling  cannula.  After administration of sedation and  rectal exam, the scope  was introduced into the  rectum and advanced under direct visualization to the cecum.  The scope  was removed subsequently slowly by carefully examining the color,  texture, anatomy and integrity of the mucosa on the way out.  The  patient was recovered in endoscopy and discharged to home in  satisfactory condition.      Kassie Mends, M.D.  Electronically Signed     SM/MEDQ  D:  09/19/2006  T:  09/19/2006  Job:  [id number]   cc:   Franchot Heidelberg, M.D.

## 2010-11-20 ENCOUNTER — Ambulatory Visit (INDEPENDENT_AMBULATORY_CARE_PROVIDER_SITE_OTHER): Payer: Medicare Other | Admitting: Internal Medicine

## 2010-11-20 ENCOUNTER — Encounter: Payer: Self-pay | Admitting: Internal Medicine

## 2010-11-20 VITALS — BP 132/68 | HR 90 | Ht 64.0 in | Wt 233.2 lb

## 2010-11-20 DIAGNOSIS — G4730 Sleep apnea, unspecified: Secondary | ICD-10-CM

## 2010-11-20 DIAGNOSIS — J40 Bronchitis, not specified as acute or chronic: Secondary | ICD-10-CM

## 2010-11-20 DIAGNOSIS — G4700 Insomnia, unspecified: Secondary | ICD-10-CM

## 2010-11-20 MED ORDER — ROPINIROLE HCL 5 MG PO TABS: 5.0000 mg | ORAL_TABLET | Freq: Every day | ORAL | Status: DC

## 2010-11-20 MED ORDER — TEMAZEPAM 15 MG PO CAPS: ORAL_CAPSULE | ORAL | Status: DC

## 2010-11-20 NOTE — Patient Instructions (Signed)
Please don't smoke at all- the cigarettes are bad for you and a waste of money.  Our goal is to use the CPAP all night, every night  You can use 1 or 2 temazepam for sleep. Don't use the higher dose unless you really need it.

## 2010-11-20 NOTE — Assessment & Plan Note (Signed)
Emphasized smoking cessation today

## 2010-11-20 NOTE — Progress Notes (Signed)
  Subjective:    Patient ID: [name], female    DOB: [birth year], 55 y.o.   MRN: [id number]  HPI 11/20/10- OSA,  bronchitis, smoker, Allergic rhinitis Last here May 17, 2010- Note reviewed. Says she stays tired. Using CPAP "about 75% of time, as much as my nose will allow me" Sleep disturbed by chronic aches and pains. She is going to have a nerve stimulator implanted.  Pain clinic here in town. Husband tells her she sleep walks, talks to people and does laundry in sleep. Takes temazepam for sleep- giving her 3-4 hours of sleep.  Spring allergy season was bad. She can't afford even generic allegra. Tried Neti pot and saline mist.  Discussed her smoking.   Review of Systems Constitutional:   No weight loss, night sweats,  Fevers, chills, fatigue, lassitude. HEENT:   No headaches,  Difficulty swallowing,  Tooth/dental problems,  Sore throat,                  CV:  No chest pain,  Orthopnea, PND, swelling in lower extremities, anasarca, dizziness, palpitations  GI  No heartburn, indigestion, abdominal pain, nausea, vomiting, diarrhea, change in bowel habits, loss of appetite  Resp:   No excess mucus, no productive cough,  No non-productive cough,  No coughing up of blood.  No change in color of mucus.  No wheezing.   Skin: no rash or lesions.  GU: no dysuria, change in color of urine, no urgency or frequency.  No flank pain.  MS:  No joint pain or swelling.  No decreased range of motion.  No back pain.  Psych:  No change in mood or affect. No depression or anxiety.  No memory loss.      Objective:   Physical Exam General- Alert, Oriented, Affect-appropriate, Distress- none acute  Obese, slow speech Skin- rash-none, lesions- none, excoriation- none  Lymphadenopathy- none  Head- atraumatic  Eyes- Gross vision intact, PERRLA, conjunctivae clear secretions  Ears- Hearing, canals, Tm- normal  Nose- Clear, No- Septal dev, mucus, polyps, erosion, perforation   Throat-  Mallampati II , mucosa clear , drainage- none, tonsils- atrophic  Neck- flexible , trachea midline, no stridor , thyroid nl, carotid no bruit  Chest - symmetrical excursion , unlabored     Heart/CV- RRR , no murmur , no gallop  , no rub, nl s1 s2                     - JVD- none , edema- none, stasis changes- none, varices- none     Lung- clear to P&A, wheeze- none, cough- none , dullness-none, rub- none     Chest wall-   Abd- tender-no, distended-no, bowel sounds-present, HSM- no  Br/ Gen/ Rectal- Not done, not indicated  Extrem- cyanosis- none, clubbing, none, atrophy- none, strength- nl  Neuro- grossly intact to observation         Assessment & Plan:

## 2010-11-20 NOTE — Assessment & Plan Note (Addendum)
Emphasized importance of using CPAP all night every night Discussed sleep hygiene and restlessness.  I dont want to give her more meds.

## 2010-11-24 ENCOUNTER — Encounter: Payer: Self-pay | Admitting: Internal Medicine

## 2010-12-07 ENCOUNTER — Ambulatory Visit (HOSPITAL_COMMUNITY)
Admission: RE | Admit: 2010-12-07 | Discharge: 2010-12-07 | Disposition: A | Discharge status: Home / Self Care | Payer: Medicare Other | Source: Ambulatory Visit | Attending: Neurosurgery | Admitting: Neurosurgery

## 2010-12-07 ENCOUNTER — Encounter (HOSPITAL_COMMUNITY)
Admission: RE | Admit: 2010-12-07 | Discharge: 2010-12-07 | Disposition: A | Discharge status: Home / Self Care | Payer: Medicare Other | Source: Ambulatory Visit | Attending: Neurosurgery | Admitting: Neurosurgery

## 2010-12-07 ENCOUNTER — Other Ambulatory Visit (HOSPITAL_COMMUNITY): Payer: Self-pay | Admitting: Neurosurgery

## 2010-12-07 DIAGNOSIS — Z01811 Encounter for preprocedural respiratory examination: Secondary | ICD-10-CM

## 2010-12-07 DIAGNOSIS — Z01818 Encounter for other preprocedural examination: Secondary | ICD-10-CM | POA: Insufficient documentation

## 2010-12-07 DIAGNOSIS — Z01812 Encounter for preprocedural laboratory examination: Secondary | ICD-10-CM | POA: Insufficient documentation

## 2010-12-07 LAB — BASIC METABOLIC PANEL
BUN: 14 mg/dL (ref 6–23)
CO2: 30 mEq/L (ref 19–32)
Calcium: 9.4 mg/dL (ref 8.4–10.5)
Creatinine, Ser: 0.98 mg/dL (ref 0.50–1.10)
GFR calc non Af Amer: 59 mL/min — ABNORMAL LOW (ref >60)
Glucose, Bld: 175 mg/dL — ABNORMAL HIGH (ref 70–99)
Sodium: 140 mEq/L (ref 135–145)

## 2010-12-07 LAB — CBC
MCH: 29.1 pg (ref 26.0–34.0)
MCV: 89.5 fL (ref 78.0–100.0)
Platelets: 93 10*3/uL — ABNORMAL LOW (ref 150–400)
RBC: 4.46 MIL/uL (ref 3.87–5.11)
RDW: 14 % (ref 11.5–15.5)
WBC: 6.5 10*3/uL (ref 4.0–10.5)

## 2010-12-07 LAB — PROTIME-INR: Prothrombin Time: 13.5 seconds (ref 11.6–15.2)

## 2010-12-11 ENCOUNTER — Ambulatory Visit (HOSPITAL_COMMUNITY): Payer: Medicare Other

## 2010-12-11 ENCOUNTER — Ambulatory Visit (HOSPITAL_COMMUNITY)
Admission: RE | Admit: 2010-12-11 | Discharge: 2010-12-12 | Disposition: A | Discharge status: Home / Self Care | Payer: Medicare Other | Source: Ambulatory Visit | Attending: Neurosurgery | Admitting: Neurosurgery

## 2010-12-11 DIAGNOSIS — G4733 Obstructive sleep apnea (adult) (pediatric): Secondary | ICD-10-CM | POA: Insufficient documentation

## 2010-12-11 DIAGNOSIS — M961 Postlaminectomy syndrome, not elsewhere classified: Secondary | ICD-10-CM | POA: Insufficient documentation

## 2010-12-11 DIAGNOSIS — M549 Dorsalgia, unspecified: Secondary | ICD-10-CM | POA: Insufficient documentation

## 2010-12-11 DIAGNOSIS — I10 Essential (primary) hypertension: Secondary | ICD-10-CM | POA: Insufficient documentation

## 2010-12-11 DIAGNOSIS — Z8673 Personal history of transient ischemic attack (TIA), and cerebral infarction without residual deficits: Secondary | ICD-10-CM | POA: Insufficient documentation

## 2010-12-11 DIAGNOSIS — J45909 Unspecified asthma, uncomplicated: Secondary | ICD-10-CM | POA: Insufficient documentation

## 2010-12-11 DIAGNOSIS — M79609 Pain in unspecified limb: Secondary | ICD-10-CM | POA: Insufficient documentation

## 2010-12-11 DIAGNOSIS — F17200 Nicotine dependence, unspecified, uncomplicated: Secondary | ICD-10-CM | POA: Insufficient documentation

## 2010-12-11 DIAGNOSIS — E119 Type 2 diabetes mellitus without complications: Secondary | ICD-10-CM | POA: Insufficient documentation

## 2010-12-11 LAB — GLUCOSE, CAPILLARY
Glucose-Capillary: 111 mg/dL — ABNORMAL HIGH (ref 70–99)
Glucose-Capillary: 167 mg/dL — ABNORMAL HIGH (ref 70–99)

## 2010-12-12 LAB — GLUCOSE, CAPILLARY: Glucose-Capillary: 163 mg/dL — ABNORMAL HIGH (ref 70–99)

## 2010-12-14 ENCOUNTER — Other Ambulatory Visit (HOSPITAL_COMMUNITY): Payer: Self-pay | Admitting: Neurosurgery

## 2010-12-14 DIAGNOSIS — M545 Low back pain: Secondary | ICD-10-CM

## 2010-12-14 DIAGNOSIS — M549 Dorsalgia, unspecified: Secondary | ICD-10-CM

## 2010-12-17 ENCOUNTER — Ambulatory Visit (HOSPITAL_COMMUNITY)
Admission: RE | Admit: 2010-12-17 | Discharge: 2010-12-17 | Disposition: A | Discharge status: Home / Self Care | Payer: Medicare Other | Source: Ambulatory Visit | Attending: Neurosurgery | Admitting: Neurosurgery

## 2010-12-17 DIAGNOSIS — M545 Low back pain: Secondary | ICD-10-CM

## 2010-12-17 DIAGNOSIS — M549 Dorsalgia, unspecified: Secondary | ICD-10-CM

## 2010-12-17 DIAGNOSIS — M546 Pain in thoracic spine: Secondary | ICD-10-CM | POA: Insufficient documentation

## 2010-12-17 DIAGNOSIS — IMO0002 Reserved for concepts with insufficient information to code with codable children: Secondary | ICD-10-CM | POA: Insufficient documentation

## 2011-01-08 NOTE — Op Note (Signed)
  NAMECARIN, SHIPP NO.:  [id number]  MEDICAL RECORD NO.:  [id number]  LOCATION:  3535                         FACILITY:  [facility]  PHYSICIAN:  Reinaldo Meeker, M.D. 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  PROCEDURE:  Permanent spinal stimulator implant.  SURGEON:  Reinaldo Meeker, MD  PROCEDURE IN DETAIL:  After placing in the prone position, the patient's back and left flank were prepped and draped in the usual sterile fashion.  Localizing x-ray was taken prior to incision to identify the appropriate level.  Midline incision was made above the spinous processes of T9 and T10.  Using Bovie cutting current, this incision was carried to the spinous processes.  Subperiosteal dissection was then carried out on the left side of the spinous processes and lamina and self-retaining retractor was placed for exposure.  X-rays showed approach to the appropriate level.  Inferior one half of the T9 lamina was removed and then ligamentum flavum was removed and epidural fats, so that we could identify the spinal dura.  We were able to slide the lead in excellent position without difficulty, centered over the T8 vertebral body directly at the midline.  We then made another incision in the left flank, made a pocket for the generator.  We passed a tunneler from the inferior to the superior incision and then passed the lead without difficulty.  We then attached to the battery, did an impedance testing which showed good contact function.  We then irrigated both wounds copiously.  The final tightening of the battery slid into the pouch.  We then closed both wounds with multiple layers of Vicryl in the muscle, fascia, subcutaneous,  and  subcuticular tissues and Dermabond was placed on the skin.  Sterile dressing was then applied.  The patient was extubated and taken to recovery room in stable condition.          ______________________________ Reinaldo Meeker, M.D.     ROK/MEDQ  D:  12/11/2010  T:  12/12/2010  Job:  [id number]  Electronically Signed by Aliene Beams M.D. on 01/08/2011 02:09:37 PM

## 2011-01-11 ENCOUNTER — Other Ambulatory Visit (HOSPITAL_COMMUNITY): Payer: Self-pay | Admitting: Family Medicine

## 2011-01-11 ENCOUNTER — Ambulatory Visit (HOSPITAL_COMMUNITY)
Admission: RE | Admit: 2011-01-11 | Discharge: 2011-01-11 | Disposition: A | Discharge status: Home / Self Care | Payer: Medicare Other | Source: Ambulatory Visit | Attending: Family Medicine | Admitting: Family Medicine

## 2011-01-11 DIAGNOSIS — S298XXA Other specified injuries of thorax, initial encounter: Secondary | ICD-10-CM | POA: Insufficient documentation

## 2011-01-11 DIAGNOSIS — W19XXXA Unspecified fall, initial encounter: Secondary | ICD-10-CM | POA: Insufficient documentation

## 2011-01-11 DIAGNOSIS — R0781 Pleurodynia: Secondary | ICD-10-CM

## 2011-01-11 DIAGNOSIS — R0789 Other chest pain: Secondary | ICD-10-CM | POA: Insufficient documentation

## 2011-01-16 ENCOUNTER — Telehealth: Payer: Self-pay | Admitting: *Deleted

## 2011-01-16 NOTE — Telephone Encounter (Signed)
Called patient to schedule her colonoscopy.  She wants me to call back tomorrow when her husband will be home to schedule.

## 2011-01-17 NOTE — Telephone Encounter (Signed)
Called back and no answer.  Left message for her to call and schedule procedure.

## 2011-01-18 ENCOUNTER — Encounter: Payer: Self-pay | Admitting: Internal Medicine

## 2011-01-18 NOTE — Telephone Encounter (Signed)
Patient called and scheduled colonoscopy 02/25/11 11:00 am Pre-visit 02/12/11

## 2011-02-25 ENCOUNTER — Other Ambulatory Visit: Payer: Medicare Other | Admitting: Internal Medicine

## 2011-03-07 LAB — BASIC METABOLIC PANEL
BUN: 6
CO2: 24
Calcium: 9.4
Chloride: 104
Creatinine, Ser: 0.82
GFR calc Af Amer: >60
GFR calc non Af Amer: >60
Glucose, Bld: 194 — ABNORMAL HIGH
Potassium: 3.7
Sodium: 137

## 2011-03-08 LAB — POCT HEMOGLOBIN-HEMACUE: Hemoglobin: 15

## 2011-03-18 ENCOUNTER — Encounter: Payer: Self-pay | Admitting: Internal Medicine

## 2011-03-27 ENCOUNTER — Encounter: Payer: Self-pay | Admitting: Internal Medicine

## 2011-03-27 ENCOUNTER — Ambulatory Visit (AMBULATORY_SURGERY_CENTER): Payer: Medicare Other | Admitting: *Deleted

## 2011-03-27 VITALS — Ht 64.0 in | Wt 220.2 lb

## 2011-03-27 DIAGNOSIS — Z1211 Encounter for screening for malignant neoplasm of colon: Secondary | ICD-10-CM

## 2011-03-27 MED ORDER — MOVIPREP 100 G PO SOLR: ORAL | Status: DC

## 2011-03-29 ENCOUNTER — Other Ambulatory Visit: Payer: Medicare Other | Admitting: Internal Medicine

## 2011-04-10 ENCOUNTER — Other Ambulatory Visit: Payer: Medicare Other | Admitting: Internal Medicine

## 2011-04-26 ENCOUNTER — Ambulatory Visit (AMBULATORY_SURGERY_CENTER): Payer: Medicare Other | Admitting: Internal Medicine

## 2011-04-26 ENCOUNTER — Encounter: Payer: Self-pay | Admitting: Internal Medicine

## 2011-04-26 VITALS — BP 94/60 | HR 72 | Temp 98.2°F | Resp 18 | Ht 64.0 in | Wt 220.0 lb

## 2011-04-26 DIAGNOSIS — Z86010 Personal history of colonic polyps: Secondary | ICD-10-CM

## 2011-04-26 DIAGNOSIS — D126 Benign neoplasm of colon, unspecified: Secondary | ICD-10-CM

## 2011-04-26 DIAGNOSIS — Z1211 Encounter for screening for malignant neoplasm of colon: Secondary | ICD-10-CM

## 2011-04-26 MED ORDER — SODIUM CHLORIDE 0.9 % IV SOLN: 500.0000 mL | INTRAVENOUS | Status: DC

## 2011-04-26 NOTE — Patient Instructions (Signed)
Please refer to the blue and neon green sheets for instructions regarding diet and activity for the rest of today.  Handout on polyps given. Resume previous medications. 

## 2011-04-29 ENCOUNTER — Telehealth: Payer: Self-pay

## 2011-04-29 NOTE — Telephone Encounter (Signed)
Restriction on phone calls.

## 2011-05-14 DIAGNOSIS — M1710 Unilateral primary osteoarthritis, unspecified knee: Secondary | ICD-10-CM | POA: Insufficient documentation

## 2011-05-14 DIAGNOSIS — Z96659 Presence of unspecified artificial knee joint: Secondary | ICD-10-CM | POA: Insufficient documentation

## 2011-05-18 ENCOUNTER — Emergency Department: Payer: Self-pay | Admitting: Emergency Medicine

## 2011-05-30 ENCOUNTER — Inpatient Hospital Stay: Payer: Self-pay | Admitting: Internal Medicine

## 2011-06-19 ENCOUNTER — Ambulatory Visit: Payer: Medicare Other | Admitting: Internal Medicine

## 2011-07-17 ENCOUNTER — Other Ambulatory Visit (HOSPITAL_COMMUNITY): Payer: Self-pay | Admitting: Family Medicine

## 2011-07-22 ENCOUNTER — Ambulatory Visit (HOSPITAL_COMMUNITY)
Admission: RE | Admit: 2011-07-22 | Discharge: 2011-07-22 | Disposition: A | Discharge status: Home / Self Care | Payer: Medicare Other | Source: Ambulatory Visit | Attending: Family Medicine | Admitting: Family Medicine

## 2011-07-22 DIAGNOSIS — R1310 Dysphagia, unspecified: Secondary | ICD-10-CM | POA: Insufficient documentation

## 2011-07-22 DIAGNOSIS — I10 Essential (primary) hypertension: Secondary | ICD-10-CM | POA: Insufficient documentation

## 2011-07-22 DIAGNOSIS — E785 Hyperlipidemia, unspecified: Secondary | ICD-10-CM | POA: Insufficient documentation

## 2011-07-22 DIAGNOSIS — E119 Type 2 diabetes mellitus without complications: Secondary | ICD-10-CM | POA: Insufficient documentation

## 2011-07-22 DIAGNOSIS — IMO0001 Reserved for inherently not codable concepts without codable children: Secondary | ICD-10-CM | POA: Insufficient documentation

## 2011-07-22 NOTE — Procedures (Signed)
Modified Barium Swallow Procedure Note Patient Details  Name: [name] MRN: [id number] Date of Birth: 1956-03-22  Today's Date: 07/22/2011 Time:  - 11:00-11:50 am    Past Medical History:  Past Medical History  Diagnosis Date  . 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 Degeneration of lumbar or lumbosacral intervertebral disc   . Dermatophytosis of the body   . Chronic pain syndrome   . Edema   . Insomnia, unspecified   . Unspecified hypertrophic and atrophic condition of skin   . Cerebral aneurysm, nonruptured   . Unspecified hereditary and idiopathic peripheral neuropathy   . Tobacco use disorder   . Unspecified urinary incontinence   . Migraine, unspecified, without mention of intractable migraine without mention of status migrainosus   . Urinary tract infection, site not specified   . Carpal tunnel syndrome   . Unspecified sleep apnea     NPSG 01-09-09 AHI 6.2, RDI 13  . Restless legs syndrome (RLS)   . Myalgia and myositis, unspecified   . Arthropathy, unspecified, site unspecified   . Peptic ulcer, unspecified site, unspecified as acute or chronic, without mention of hemorrhage, perforation, or obstruction   . Bronchitis, not specified as acute or chronic   . Osteoarthrosis, unspecified whether generalized or localized, unspecified site   . Unspecified essential hypertension   . Other and unspecified hyperlipidemia   . Esophageal reflux   . Type II or unspecified type diabetes mellitus without mention of complication, not stated as uncontrolled   . Depressive disorder, not elsewhere classified   . Unspecified asthma   . Anxiety state, unspecified   . Allergic rhinitis, cause unspecified     Skin Test POS 06-26-09  . Obesity   . Cirrhosis   . Colon polyp    Past Surgical History:  Past Surgical History  Procedure Date  . Bladder lift 1993  . Cerebreal anuerysm 2008  . Tonsillectomy age 63  . Laproscopic surgery abdomen for intestine  infection 1984  . Carpal tunnel release 1996 2009    x 2 bilateral  . Partial hysterectomy age 57    unlcear why this was done  . Colonoscopy    HPI:  Mrs. [name] is a 56 year old woman who was referred for MBSS by her doctor at the request of her speech pathologist, Celedonio Miyamoto. She states that her intermittent swallowing problems started after an E-coli infection which she was hospitalized for about a month ago. The patient states that her swallow feels more effortful and harder to initiate. She particularly has a hard time with swallowing her pills and mixed consistencies.   Symptoms/Limitations Symptoms: "I have trouble swallowing at times." Special Tests: MBSS  Recommendation/Prognosis  Clinical Impression Dysphagia Diagnosis: Mild oral phase dysphagia;Mild pharyngeal phase dysphagia Clinical impression: Overall mild oropharyngeal phase dysphagia characterized by decreased anterior posterior transit with pills, brief containment of bolus anterior to velum, with rapid spillover into pharynx, min decreased hyolaryngeal excursion and epiglottic deflection, resulting in trace/flash penetration of thins during the swallow without aspiration. The pill was briefly retained in the valleculae to which patient was sensate, but cleared with immediate liquid wash. Swallow Evaluation Recommendations Solid Consistency: Regular (pt may prefer to avoid mixed consistency of thin/solid.) Liquid Consistency: Thin Liquid Administration via: Cup Medication Administration:  (crush or break large pills as able and/or take with nectars) Supervision: Patient able to self feed Compensations: Slow rate Postural Changes and/or Swallow Maneuvers: Seated upright 90 degrees;Upright 30-60 min after  meal Oral Care Recommendations: Oral care BID;Patient independent with oral care Other Recommendations: Clarify dietary restrictions Follow up Recommendations:  (per treating SLP)   Individuals  Consulted Consulted and Agree with Results and Recommendations: Patient Report Sent to : Referring physician;Primary SLP     General:  Date of Onset: 06/11/11 HPI: Mrs. [name] is a 56 year old woman who was referred for MBSS by her doctor at the request of her speech pathologist, Celedonio Miyamoto. She states that her intermittent swallowing problems started after an E-coli infection which she was hospitalized for about a month ago. The patient states that her swallow feels more effortful and harder to initiate. She particularly has a hard time with swallowing her pills and mixed consistencies.  Type of Study: Initial MBS Diet Prior to this Study: Regular;Thin liquids Temperature Spikes Noted: No Respiratory Status: Room air History of Intubation: No Behavior/Cognition: Alert;Cooperative Oral Cavity - Dentition: Adequate natural dentition Oral Motor / Sensory Function: Within functional limits Vision: Functional for self-feeding Patient Positioning: Upright in chair Baseline Vocal Quality: Clear Volitional Cough: Strong Volitional Swallow: Able to elicit Anatomy: Within functional limits Pharyngeal Secretions: Not observed secondary MBS  Reason for Referral:  Objectively evaluate swallow function due to concerns of possible aspiration and identify appropriate diet and compensatory strategies as needed.  Oral Phase Oral Preparation/Oral Phase Oral Phase: Impaired Oral - Solids Oral - Pill: Reduced posterior propulsion Oral Phase - Comment Oral Phase - Comment: Essentially WFL, however pt holds bolus briefly before spillover into pharynx.  Pharyngeal Phase  Pharyngeal Phase Pharyngeal Phase: Impaired Pharyngeal - Thin Pharyngeal - Thin Cup: Reduced epiglottic inversion;Reduced anterior laryngeal mobility;Reduced laryngeal elevation;Penetration/Aspiration during swallow Penetration/Aspiration details (thin cup): Material does not enter airway;Material enters airway, remains  ABOVE vocal cords then ejected out Pharyngeal - Thin Straw: Premature spillage to pyriform;Reduced epiglottic inversion;Reduced anterior laryngeal mobility;Reduced laryngeal elevation;Penetration/Aspiration during swallow Penetration/Aspiration details (thin straw): Material does not enter airway;Material enters airway, remains ABOVE vocal cords then ejected out Pharyngeal - Solids Pharyngeal - Pill: Delayed swallow initiation;Premature spillage to valleculae;Reduced epiglottic inversion;Reduced laryngeal elevation;Reduced tongue base retraction;Compensatory strategies attempted (Comment) (pill stops briefly in valleculae but clears with liquid wash) Pharyngeal Phase - Comment Pharyngeal Comment: Min pharyngeal phase dysphagia, see below.  Cervical Esophageal Phase  Cervical Esophageal Phase Cervical Esophageal Phase: [facility]  Havery Moros, CCC-SLP [phone]   PORTER,DABNEY 07/22/2011, 1:48 PM

## 2011-07-24 ENCOUNTER — Encounter: Payer: Self-pay | Admitting: Internal Medicine

## 2011-07-26 ENCOUNTER — Inpatient Hospital Stay: Payer: Self-pay | Admitting: Internal Medicine

## 2011-07-26 LAB — URINALYSIS, COMPLETE
Bacteria: NONE SEEN
Bilirubin,UR: NEGATIVE
Glucose,UR: NEGATIVE mg/dL (ref 0–75)
Ph: 6 (ref 4.5–8.0)
Protein: NEGATIVE
RBC,UR: NONE SEEN /HPF (ref 0–5)
Specific Gravity: 1.005 (ref 1.003–1.030)
WBC UR: 2 /HPF (ref 0–5)

## 2011-07-26 LAB — COMPREHENSIVE METABOLIC PANEL
Albumin: 3.7 g/dL (ref 3.4–5.0)
Anion Gap: 5 — ABNORMAL LOW (ref 7–16)
BUN: 23 mg/dL — ABNORMAL HIGH (ref 7–18)
Calcium, Total: 10.1 mg/dL (ref 8.5–10.1)
EGFR (Non-African Amer.): 42 — ABNORMAL LOW
Glucose: 88 mg/dL (ref 65–99)
Potassium: 3.6 mmol/L (ref 3.5–5.1)
SGOT(AST): 31 U/L (ref 15–37)
SGPT (ALT): 31 U/L
Total Protein: 7.8 g/dL (ref 6.4–8.2)

## 2011-07-26 LAB — CBC
HCT: 37.4 % (ref 35.0–47.0)
HGB: 12.3 g/dL (ref 12.0–16.0)
MCH: 29.9 pg (ref 26.0–34.0)
MCHC: 32.9 g/dL (ref 32.0–36.0)
MCV: 91 fL (ref 80–100)
Platelet: 82 10*3/uL — ABNORMAL LOW (ref 150–440)

## 2011-07-26 LAB — AMMONIA: Ammonia, Plasma: <25 mcmol/L (ref 11–32)

## 2011-07-26 LAB — CK TOTAL AND CKMB (NOT AT ARMC)
CK, Total: 149 U/L (ref 21–215)
CK-MB: 6.4 ng/mL — ABNORMAL HIGH (ref 0.5–3.6)

## 2011-07-26 LAB — MAGNESIUM: Magnesium: 2 mg/dL

## 2011-07-26 LAB — TROPONIN I: Troponin-I: <0.02 ng/mL

## 2011-07-26 LAB — PROTIME-INR: Prothrombin Time: 13.9 secs (ref 11.5–14.7)

## 2011-07-27 LAB — CK TOTAL AND CKMB (NOT AT ARMC): CK-MB: 1.8 ng/mL (ref 0.5–3.6)

## 2011-07-27 LAB — COMPREHENSIVE METABOLIC PANEL
Anion Gap: 9 (ref 7–16)
BUN: 18 mg/dL (ref 7–18)
Bilirubin,Total: 0.6 mg/dL (ref 0.2–1.0)
Chloride: 108 mmol/L — ABNORMAL HIGH (ref 98–107)
Co2: 27 mmol/L (ref 21–32)
Creatinine: 1.11 mg/dL (ref 0.60–1.30)
EGFR (African American): >60
Osmolality: 288 (ref 275–301)
Potassium: 3.8 mmol/L (ref 3.5–5.1)
SGPT (ALT): 26 U/L
Total Protein: 6.6 g/dL (ref 6.4–8.2)

## 2011-07-27 LAB — CBC WITH DIFFERENTIAL/PLATELET
Basophil %: 0.6 %
Eosinophil #: 0.2 10*3/uL (ref 0.0–0.7)
Eosinophil %: 4.6 %
HCT: 34 % — ABNORMAL LOW (ref 35.0–47.0)
HGB: 11.4 g/dL — ABNORMAL LOW (ref 12.0–16.0)
Lymphocyte #: 1.3 10*3/uL (ref 1.0–3.6)
MCH: 30.1 pg (ref 26.0–34.0)
MCHC: 33.4 g/dL (ref 32.0–36.0)
MCV: 90 fL (ref 80–100)
Monocyte #: 0.3 10*3/uL (ref 0.0–0.7)
Neutrophil #: 2.4 10*3/uL (ref 1.4–6.5)
Neutrophil %: 57.7 %
RBC: 3.78 10*6/uL — ABNORMAL LOW (ref 3.80–5.20)
RDW: 16.4 % — ABNORMAL HIGH (ref 11.5–14.5)

## 2011-07-27 LAB — MAGNESIUM: Magnesium: 1.8 mg/dL

## 2011-07-27 LAB — TROPONIN I: Troponin-I: <0.02 ng/mL

## 2011-07-27 LAB — PROTIME-INR
INR: 1.2
Prothrombin Time: 15.1 secs — ABNORMAL HIGH (ref 11.5–14.7)

## 2011-07-27 LAB — URINE CULTURE

## 2011-07-28 LAB — CBC WITH DIFFERENTIAL/PLATELET
Basophil %: 0.6 %
Eosinophil #: 0.1 10*3/uL (ref 0.0–0.7)
Eosinophil %: 3.7 %
Lymphocyte #: 1.2 10*3/uL (ref 1.0–3.6)
Lymphocyte %: 39.3 %
MCH: 29.7 pg (ref 26.0–34.0)
Neutrophil %: 49.9 %

## 2011-07-28 LAB — BASIC METABOLIC PANEL
Anion Gap: 11 (ref 7–16)
BUN: 12 mg/dL (ref 7–18)
Calcium, Total: 8.5 mg/dL (ref 8.5–10.1)
Co2: 22 mmol/L (ref 21–32)
Osmolality: 292 (ref 275–301)
Potassium: 3.6 mmol/L (ref 3.5–5.1)
Sodium: 147 mmol/L — ABNORMAL HIGH (ref 136–145)

## 2011-07-28 LAB — MAGNESIUM: Magnesium: 1.8 mg/dL

## 2011-07-31 LAB — CULTURE, BLOOD (SINGLE)

## 2011-08-26 DIAGNOSIS — G4700 Insomnia, unspecified: Secondary | ICD-10-CM | POA: Insufficient documentation

## 2011-09-16 ENCOUNTER — Ambulatory Visit (HOSPITAL_COMMUNITY): Payer: Medicare Other | Admitting: Physical Therapy

## 2011-09-19 ENCOUNTER — Encounter: Payer: Self-pay | Admitting: Internal Medicine

## 2011-09-19 ENCOUNTER — Ambulatory Visit (INDEPENDENT_AMBULATORY_CARE_PROVIDER_SITE_OTHER): Payer: Medicare Other | Admitting: Internal Medicine

## 2011-09-19 ENCOUNTER — Other Ambulatory Visit (INDEPENDENT_AMBULATORY_CARE_PROVIDER_SITE_OTHER): Payer: Medicare Other

## 2011-09-19 VITALS — BP 132/72 | HR 60 | Ht 64.0 in | Wt 189.5 lb

## 2011-09-19 DIAGNOSIS — K7460 Unspecified cirrhosis of liver: Secondary | ICD-10-CM

## 2011-09-19 DIAGNOSIS — K219 Gastro-esophageal reflux disease without esophagitis: Secondary | ICD-10-CM

## 2011-09-19 DIAGNOSIS — K7689 Other specified diseases of liver: Secondary | ICD-10-CM

## 2011-09-19 DIAGNOSIS — K766 Portal hypertension: Secondary | ICD-10-CM

## 2011-09-19 DIAGNOSIS — Z86010 Personal history of colonic polyps: Secondary | ICD-10-CM

## 2011-09-19 DIAGNOSIS — K7581 Nonalcoholic steatohepatitis (NASH): Secondary | ICD-10-CM

## 2011-09-19 LAB — BASIC METABOLIC PANEL
GFR: 79.91 mL/min (ref >60.00)
Potassium: 3.7 mEq/L (ref 3.5–5.1)
Sodium: 140 mEq/L (ref 135–145)

## 2011-09-19 LAB — CBC WITH DIFFERENTIAL/PLATELET
Basophils Absolute: 0 10*3/uL (ref 0.0–0.1)
Eosinophils Relative: 4 % (ref 0.0–5.0)
HCT: 40.3 % (ref 36.0–46.0)
Lymphs Abs: 1.7 10*3/uL (ref 0.7–4.0)
MCV: 90.3 fl (ref 78.0–100.0)
Monocytes Absolute: 0.4 10*3/uL (ref 0.1–1.0)
Platelets: 74 10*3/uL — ABNORMAL LOW (ref 150.0–400.0)
RDW: 13.1 % (ref 11.5–14.6)

## 2011-09-19 LAB — HEPATIC FUNCTION PANEL
AST: 29 U/L (ref 0–37)
Alkaline Phosphatase: 70 U/L (ref 39–117)
Bilirubin, Direct: 0.1 mg/dL (ref 0.0–0.3)
Total Bilirubin: 0.5 mg/dL (ref 0.3–1.2)

## 2011-09-19 LAB — PROTIME-INR: Prothrombin Time: 10.7 s (ref 10.2–12.4)

## 2011-09-19 NOTE — Patient Instructions (Addendum)
You have been given a separate informational sheet regarding your tobacco use, the importance of quitting and local resources to help you quit.  Your physician has requested that you go to the basement for lab work before leaving today  You have been scheduled for an abdominal ultrasound at [facility] (1st floor of hospital) on 09-24-11 at 9:00. Please arrive 15 minutes prior to your appointment for registration. Make certain not to have anything to eat or drink after midnight prior to your appointment. Should you need to reschedule your appointment, please contact radiology at [phone].  You have been scheduled for an endoscopy with propofol. Please follow written instructions given to you at your visit today.

## 2011-09-19 NOTE — Progress Notes (Signed)
HISTORY OF PRESENT ILLNESS:  Kristina Kristina is a 55 y.o. female with innumerable medical problems as listed below. Her history is quite complex. 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 She did have previous GI care elsewhere. She was last seen in the office in August of 2010. She was to follow up in one year, but did not. She has been partially vaccinated for hepatitis B and hepatitis A. Her upper endoscopy that year was negative for varices. I did see her as an outpatient for surveillance colonoscopy November 2012. Diminutive polyp removed. Prep limitations as noted. Follow up in 5 years with modifications as noted. Since that time, she tells me that she was hospitalized with Escherichia coli urosepsis. She continues to suffer with her multiple illness. She has a new primary provider since her last office of evaluation. Chief concern today is "having a check up on my liver". He continues with chronic urachal bowel-type complaints such as alternating bowel habits, bloating, and belching. She has had significant weight loss. About 40 pounds of weight loss since her last office evaluation. She denies melena, hematochezia, ascites, peripheral edema, or abdominal pain. She has had some difficulties with mentation nonspecifically. She does have GERD symptoms and takes PPI on demand. No dysphagia.   As an aside, she requested I take her husband as the patient as she is dissatisfied with his health care elsewhere.  REVIEW OF SYSTEMS:  All non-GI ROS negative except for sinus an allergy trouble, back pain, vision change, cough, depression, fatigue, headaches, hearing problems, itching, muscle pains, night sweats, sleeping problems, transient ankle edema, increased thirst, increased urination, urinary leakage, voice changes for which she saw ENT recently  Past Medical History  Diagnosis Date  . Degeneration of thoracic or thoracolumbar  intervertebral disc   . Degeneration of lumbar or lumbosacral intervertebral disc   . Dermatophytosis of the body   . Chronic pain syndrome   . Edema   . Insomnia, unspecified   . Unspecified hypertrophic and atrophic condition of skin   . Cerebral aneurysm, nonruptured   . Unspecified hereditary and idiopathic peripheral neuropathy   . Tobacco use disorder   . Unspecified urinary incontinence   . Migraine, unspecified, without mention of intractable migraine without mention of status migrainosus   . Urinary tract infection, site not specified   . Carpal tunnel syndrome   . Unspecified sleep apnea     NPSG 01-09-09 AHI 6.2, RDI 13  . Restless legs syndrome (RLS)   . Myalgia and myositis, unspecified   . Arthropathy, unspecified, site unspecified   . Peptic ulcer, unspecified site, unspecified as acute or chronic, without mention of hemorrhage, perforation, or obstruction   . Bronchitis, not specified as acute or chronic   . Osteoarthrosis, unspecified whether generalized or localized, unspecified site   . Unspecified essential hypertension   . Other and unspecified hyperlipidemia   . Esophageal reflux   . Type II or unspecified type diabetes mellitus without mention of complication, not stated as uncontrolled   . Depressive disorder, not elsewhere classified   . Unspecified asthma   . Anxiety state, unspecified   . Allergic rhinitis, cause unspecified     Skin Test POS 06-26-09  . Obesity   . Cirrhosis   . Colon polyp   . Anemia     Past Surgical History  Procedure Date  . Bladder lift 1993  . Cerebreal anuerysm 2008  .  Tonsillectomy age 6  . Laproscopic surgery abdomen for intestine infection 1984  . Carpal tunnel release 1996 2009    x 2 bilateral  . Partial hysterectomy age 13    unlcear why this was done  . Colonoscopy   . Total knee arthroplasty     right  . Clavicle surgery     fracture    Social History Kristina Kristina  reports that she has been smoking  Cigarettes.  She has been smoking about .5 packs per day. She has never used smokeless tobacco. She reports that she drinks alcohol. She reports that she does not use illicit drugs.  family history includes Alzheimer's disease in her father; Breast Weaver in her maternal aunt; COPD in her mother; Colon polyps in her mother; Diabetes in her father and mother; Heart disease in her maternal aunt and mother; Irritable bowel syndrome in her mother; Kristina Kristina in her mother; Ovarian Weaver in her maternal aunt; and Uterine Weaver in her mother.  Allergies  Allergen Reactions  . Bupropion Hcl     REACTION: Rash, Dizzy  . Erythromycin     REACTION: Rash  . Haloperidol Lactate     REACTION: Anxious, Tardive dyskinesea  . Latex     REACTION: Rash and hives  . Triple Antibiotic     REACTION: "Puss in wounds" - can use bacitracin       PHYSICAL EXAMINATION: Vital signs: BP 132/72  Pulse 60  Ht 5\' 4"  (1.626 m)  Wt 189 lb 8 oz (85.957 kg)  BMI 32.53 kg/m2  Constitutional: chronically ill-appearing, no acute distress Psychiatric: alert and oriented x3, cooperative Eyes: extraocular movements intact, anicteric, conjunctiva pink Mouth: oral pharynx moist, no lesions Neck: supple no lymphadenopathy Cardiovascular: heart regular rate and rhythm, no murmur Lungs: clear to auscultation bilaterally Abdomen: soft,obese, nontender, nondistended, no obvious ascites, no peritoneal signs, normal bowel sounds, no organomegaly Rectal:normal in November. Not repeated Extremities: no lower extremity edema bilaterally Skin: no lesions on visible extremities Neuro: No focal deficits. No asterixis.    ASSESSMENT:  #1. 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 Appears to be compensated. #2. Portal hypertension. No varices on EGD in 2010. #3. GERD with esophagitis on endoscopy #4. History of adenomatous polyps. Last colonoscopy November 2012 #5. Multiple significant medical problems   PLAN:  #1.  CBC, comprehensive metabolic panel, PT/INR #2. Alpha-fetoprotein #3. Abdominal/liver ultrasound #4. Screening upper endoscopy for varices. The patient is high-risk given her comorbidities.The nature of the procedure, as well as the risks, benefits, and alternatives were carefully and thoroughly reviewed with the patient. Ample time for discussion and questions allowed. The patient understood, was satisfied, and agreed to proceed.  #5. PPI as needed to control GERD symptoms #6.Fiber for alternating bowel habits #7. Ongoing general medical care with PCP #8. Surveillance colonoscopy around November 2017

## 2011-09-24 ENCOUNTER — Other Ambulatory Visit (HOSPITAL_COMMUNITY): Payer: Medicare Other

## 2011-10-17 ENCOUNTER — Ambulatory Visit (AMBULATORY_SURGERY_CENTER): Payer: Medicare Other | Admitting: Internal Medicine

## 2011-10-17 ENCOUNTER — Other Ambulatory Visit: Payer: Self-pay | Admitting: Internal Medicine

## 2011-10-17 ENCOUNTER — Encounter: Payer: Self-pay | Admitting: Internal Medicine

## 2011-10-17 VITALS — BP 150/67 | HR 62 | Temp 97.1°F | Resp 16 | Ht 64.0 in | Wt 189.0 lb

## 2011-10-17 DIAGNOSIS — K2980 Duodenitis without bleeding: Secondary | ICD-10-CM

## 2011-10-17 DIAGNOSIS — K7460 Unspecified cirrhosis of liver: Secondary | ICD-10-CM

## 2011-10-17 DIAGNOSIS — K219 Gastro-esophageal reflux disease without esophagitis: Secondary | ICD-10-CM

## 2011-10-17 MED ORDER — SODIUM CHLORIDE 0.9 % IV SOLN: 500.0000 mL | INTRAVENOUS | Status: DC

## 2011-10-17 NOTE — Progress Notes (Signed)
Patient did not experience any of the following events: a burn prior to discharge; a fall within the facility; wrong site/side/patient/procedure/implant event; or a hospital transfer or hospital admission upon discharge from the facility. (G8907)Patient did not experience any of the following events: a burn prior to discharge; a fall within the facility; wrong site/side/patient/procedure/implant event; or a hospital transfer or hospital admission upon discharge from the facility. (G8907)Patient did not have preoperative order for IV antibiotic SSI prophylaxis. (G8918) 

## 2011-10-17 NOTE — Op Note (Signed)
[facility] [address] [place], [place]  [address]  ENDOSCOPY PROCEDURE REPORT  PATIENT:  [name], [name]  MR#:  [id number] BIRTHDATE:  [birth year], 55 yrs. old  GENDER:  female  ENDOSCOPIST:  Wilhemina Bonito. Eda Keys, MD Referred by:  Office  PROCEDURE DATE:  10/17/2011 PROCEDURE:  EGD with biopsy, 29562 ASA CLASS:  Class III INDICATIONS:  Evaluate for esophageal varices in a patient with portal hypertension and/or cirrhosis.  MEDICATIONS:   MAC sedation, administered by CRNA, propofol (Diprivan) 150 mg IV TOPICAL ANESTHETIC:  none  DESCRIPTION OF PROCEDURE:   After the risks benefits and alternatives of the procedure were thoroughly explained, informed consent was obtained.  The LB [id number]-H180 [id number] endoscope was introduced through the mouth and advanced to the second portion of the duodenum, without limitations.  The instrument was slowly withdrawn as the mucosa was fully examined. <<PROCEDUREIMAGES>>  Reflux Esophagitis was found in the distal esophagus. NO VARICES. 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  Duodenitis (erosions) was found in the bulb of the duodenum.  CLO bx taken. Otherwise the examination was normal.    Retroflexed views revealed no abnormalities.    The scope was then withdrawn from the patient and the procedure completed.  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 NO VARICES  RECOMMENDATIONS: 1) My office will arrange for you to have an abdominal ultrasound performed "CIRRHOSIS, EVALUATE". 2)  Repeat endoscopy in 2-3 years 3) Routine office appointment with Dr. Marina Goodell in one year 4) prilosec or generic omeprazole 20 mg daily  RETURN TO THE CARE OF DR Jiles Garter  ______________________________ Wilhemina Bonito. Eda Keys, MD  CC:  Cristie Hem, MD;  The Patient  n. eSIGNED:    Wilhemina Bonito. Eda Keys at 10/17/2011 03:42 PM  Anselm Jungling, [phone]

## 2011-10-17 NOTE — Patient Instructions (Signed)

## 2011-10-18 ENCOUNTER — Telehealth: Payer: Self-pay

## 2011-10-18 NOTE — Telephone Encounter (Signed)
  Follow up Call-  Call back number 10/17/2011 04/26/2011  Post procedure Call Back phone  # [phone]-ok to leave message per pt  Permission to leave phone message Yes -     Patient questions:  Do you have a fever, pain , or abdominal swelling? no Pain Score  0 *  Have you tolerated food without any problems? yes  Have you been able to return to your normal activities? yes  Do you have any questions about your discharge instructions: Diet   no Medications  no Follow up visit  no  Do you have questions or concerns about your Care? no  Actions: * If pain score is 4 or above: No action needed, pain <4.

## 2011-10-21 ENCOUNTER — Telehealth: Payer: Self-pay

## 2011-10-21 DIAGNOSIS — K219 Gastro-esophageal reflux disease without esophagitis: Secondary | ICD-10-CM

## 2011-10-21 NOTE — Telephone Encounter (Signed)
Pt scheduled for Ultrasound of abdomen for 10/22/11 at [facility] arrival time 8:15am for an 8:30am appt. Pt to be NPO after midnight. Spoke with pt and she states they cannot make that appt date and time. Pt given the phone number [phone] to reschedule her appt. To a date that works for her. Pt verbalized understanding.

## 2011-10-22 ENCOUNTER — Other Ambulatory Visit (HOSPITAL_COMMUNITY): Payer: Medicare Other

## 2011-10-28 ENCOUNTER — Telehealth: Payer: Self-pay | Admitting: Internal Medicine

## 2011-10-28 NOTE — Telephone Encounter (Signed)
Pt wanted to know if her kidneys would be looked at with the ultrasound of the abdomen. Per radiology at [facility] the kidneys are included in a complete US of the abdomen. Pt aware.

## 2011-10-30 ENCOUNTER — Ambulatory Visit (HOSPITAL_COMMUNITY)
Admission: RE | Admit: 2011-10-30 | Discharge: 2011-10-30 | Disposition: A | Discharge status: Home / Self Care | Payer: Medicare Other | Source: Ambulatory Visit | Attending: Internal Medicine | Admitting: Internal Medicine

## 2011-10-30 DIAGNOSIS — K7460 Unspecified cirrhosis of liver: Secondary | ICD-10-CM | POA: Insufficient documentation

## 2011-11-11 ENCOUNTER — Other Ambulatory Visit: Payer: Self-pay | Admitting: Anesthesiology

## 2011-11-11 ENCOUNTER — Ambulatory Visit (HOSPITAL_COMMUNITY)
Admission: RE | Admit: 2011-11-11 | Discharge: 2011-11-11 | Disposition: A | Discharge status: Home / Self Care | Payer: Medicare Other | Source: Ambulatory Visit | Attending: Anesthesiology | Admitting: Anesthesiology

## 2011-11-11 DIAGNOSIS — M533 Sacrococcygeal disorders, not elsewhere classified: Secondary | ICD-10-CM | POA: Insufficient documentation

## 2011-11-11 DIAGNOSIS — M545 Low back pain, unspecified: Secondary | ICD-10-CM | POA: Insufficient documentation

## 2011-12-05 ENCOUNTER — Ambulatory Visit: Payer: Medicare Other | Admitting: Internal Medicine

## 2011-12-19 ENCOUNTER — Emergency Department: Payer: Self-pay | Admitting: *Deleted

## 2011-12-19 LAB — COMPREHENSIVE METABOLIC PANEL
Albumin: 4.3 g/dL (ref 3.4–5.0)
Alkaline Phosphatase: 96 U/L (ref 50–136)
BUN: 15 mg/dL (ref 7–18)
Bilirubin,Total: 1.1 mg/dL — ABNORMAL HIGH (ref 0.2–1.0)
Chloride: 111 mmol/L — ABNORMAL HIGH (ref 98–107)
Creatinine: 0.96 mg/dL (ref 0.60–1.30)
EGFR (African American): >60
EGFR (Non-African Amer.): >60
Potassium: 3.7 mmol/L (ref 3.5–5.1)
SGPT (ALT): 29 U/L
Sodium: 143 mmol/L (ref 136–145)

## 2011-12-19 LAB — URINALYSIS, COMPLETE
Blood: NEGATIVE
Glucose,UR: NEGATIVE mg/dL (ref 0–75)
Leukocyte Esterase: NEGATIVE
Nitrite: NEGATIVE
Protein: NEGATIVE
Specific Gravity: 1.015 (ref 1.003–1.030)
WBC UR: 2 /HPF (ref 0–5)

## 2011-12-19 LAB — CBC
MCH: 29.2 pg (ref 26.0–34.0)
MCHC: 32.5 g/dL (ref 32.0–36.0)
MCV: 90 fL (ref 80–100)
Platelet: 89 10*3/uL — ABNORMAL LOW (ref 150–440)
RDW: 13.6 % (ref 11.5–14.5)

## 2012-01-10 ENCOUNTER — Ambulatory Visit: Payer: Medicare Other | Admitting: Internal Medicine

## 2012-01-15 ENCOUNTER — Telehealth: Payer: Self-pay | Admitting: Internal Medicine

## 2012-01-15 NOTE — Telephone Encounter (Signed)
Pt states that Dr. Marina Goodell was going to review her husbands records and see him as a new pt. States he is still losing weight, he has seen Dr. Jena Gauss in the past. Her husbands name is [name] Stalvey.

## 2012-01-17 ENCOUNTER — Encounter (HOSPITAL_COMMUNITY): Payer: Self-pay | Admitting: *Deleted

## 2012-01-17 ENCOUNTER — Emergency Department (HOSPITAL_COMMUNITY): Payer: Medicare Other

## 2012-01-17 ENCOUNTER — Emergency Department (HOSPITAL_COMMUNITY)
Admission: EM | Admit: 2012-01-17 | Discharge: 2012-01-17 | Disposition: A | Discharge status: Home / Self Care | Payer: Medicare Other | Attending: Emergency Medicine | Admitting: Emergency Medicine

## 2012-01-17 DIAGNOSIS — R10816 Epigastric abdominal tenderness: Secondary | ICD-10-CM | POA: Insufficient documentation

## 2012-01-17 DIAGNOSIS — Z8711 Personal history of peptic ulcer disease: Secondary | ICD-10-CM | POA: Insufficient documentation

## 2012-01-17 DIAGNOSIS — R000 Tachycardia, unspecified: Secondary | ICD-10-CM | POA: Insufficient documentation

## 2012-01-17 DIAGNOSIS — R4181 Age-related cognitive decline: Secondary | ICD-10-CM | POA: Insufficient documentation

## 2012-01-17 DIAGNOSIS — R259 Unspecified abnormal involuntary movements: Secondary | ICD-10-CM | POA: Insufficient documentation

## 2012-01-17 DIAGNOSIS — R1110 Vomiting, unspecified: Secondary | ICD-10-CM | POA: Insufficient documentation

## 2012-01-17 DIAGNOSIS — M549 Dorsalgia, unspecified: Secondary | ICD-10-CM | POA: Insufficient documentation

## 2012-01-17 DIAGNOSIS — E119 Type 2 diabetes mellitus without complications: Secondary | ICD-10-CM | POA: Insufficient documentation

## 2012-01-17 DIAGNOSIS — F411 Generalized anxiety disorder: Secondary | ICD-10-CM | POA: Insufficient documentation

## 2012-01-17 DIAGNOSIS — E876 Hypokalemia: Secondary | ICD-10-CM | POA: Insufficient documentation

## 2012-01-17 DIAGNOSIS — R109 Unspecified abdominal pain: Secondary | ICD-10-CM | POA: Insufficient documentation

## 2012-01-17 LAB — URINALYSIS, ROUTINE W REFLEX MICROSCOPIC
Glucose, UA: NEGATIVE mg/dL
Ketones, ur: 15 mg/dL — AB
Leukocytes, UA: NEGATIVE
pH: 8 (ref 5.0–8.0)

## 2012-01-17 LAB — CBC WITH DIFFERENTIAL/PLATELET
Basophils Relative: 0 % (ref 0–1)
Eosinophils Absolute: 0 10*3/uL (ref 0.0–0.7)
Eosinophils Relative: 0 % (ref 0–5)
HCT: 38.3 % (ref 36.0–46.0)
Hemoglobin: 13.7 g/dL (ref 12.0–15.0)
Lymphocytes Relative: 20 % (ref 12–46)
MCHC: 35.8 g/dL (ref 30.0–36.0)
Monocytes Relative: 4 % (ref 3–12)
Neutro Abs: 5.3 10*3/uL (ref 1.7–7.7)

## 2012-01-17 LAB — COMPREHENSIVE METABOLIC PANEL
Alkaline Phosphatase: 80 U/L (ref 39–117)
BUN: 17 mg/dL (ref 6–23)
CO2: 15 mEq/L — ABNORMAL LOW (ref 19–32)
Chloride: 108 mEq/L (ref 96–112)
GFR calc Af Amer: 86 mL/min — ABNORMAL LOW (ref >90)
GFR calc non Af Amer: 74 mL/min — ABNORMAL LOW (ref >90)
Glucose, Bld: 180 mg/dL — ABNORMAL HIGH (ref 70–99)
Potassium: 2.7 mEq/L — CL (ref 3.5–5.1)
Total Bilirubin: 1.3 mg/dL — ABNORMAL HIGH (ref 0.3–1.2)

## 2012-01-17 LAB — LIPASE, BLOOD: Lipase: 49 U/L (ref 11–59)

## 2012-01-17 MED ORDER — ONDANSETRON HCL 4 MG/2ML IJ SOLN
4.0000 mg | Freq: Once | INTRAMUSCULAR | Status: AC
  Administered 2012-01-17: 4 mg via INTRAVENOUS
  Filled 2012-01-17: qty 2

## 2012-01-17 MED ORDER — SODIUM CHLORIDE 0.9 % IV BOLUS (SEPSIS)
1000.0000 mL | Freq: Once | INTRAVENOUS | Status: AC
  Administered 2012-01-17: 1000 mL via INTRAVENOUS

## 2012-01-17 MED ORDER — POTASSIUM CHLORIDE CRYS ER 20 MEQ PO TBCR
40.0000 meq | EXTENDED_RELEASE_TABLET | Freq: Once | ORAL | Status: AC
  Administered 2012-01-17: 40 meq via ORAL
  Filled 2012-01-17: qty 2

## 2012-01-17 MED ORDER — ONDANSETRON HCL 8 MG PO TABS: 8.0000 mg | ORAL_TABLET | Freq: Three times a day (TID) | ORAL | Status: AC | PRN

## 2012-01-17 MED ORDER — POTASSIUM CHLORIDE CRYS ER 20 MEQ PO TBCR: 20.0000 meq | EXTENDED_RELEASE_TABLET | Freq: Two times a day (BID) | ORAL | Status: DC

## 2012-01-17 MED ORDER — HYDROMORPHONE HCL PF 1 MG/ML IJ SOLN
1.0000 mg | Freq: Once | INTRAMUSCULAR | Status: AC
  Administered 2012-01-17: 1 mg via INTRAVENOUS
  Filled 2012-01-17: qty 1

## 2012-01-17 MED ORDER — SODIUM CHLORIDE 0.9 % IV SOLN
INTRAVENOUS | Status: DC
  Administered 2012-01-17: 09:00:00 via INTRAVENOUS
  Administered 2012-01-17: 125 mL via INTRAVENOUS

## 2012-01-17 NOTE — ED Notes (Signed)
Patient is alert and oriented x3.  She is complaining of vomiting that started at 3am She is also complaining of abdominal pain and back pain.  She currently is rating her  Pain 10 of 10 in her back and abdomen

## 2012-01-17 NOTE — ED Provider Notes (Signed)
History     CSN: [id number]  Arrival date & time 01/17/12  0820   First MD Initiated Contact with Patient 01/17/12 [phone]      Chief Complaint  Patient presents with  . Emesis  . Abdominal Pain  . Back Pain    (Consider location/radiation/quality/duration/timing/severity/associated sxs/prior treatment) HPI Comments: JASHIYA BASSETT is a 56 y.o. Female who is ill for one day with vomiting. She has exacerbation of chronic back pain, as well as abdominal pain today. She is out of her chronic pain medications, but had a new prescription yesterday. She states she also uses a diuretic intermittently for leg swelling. She has used it several times in the last 2 days. The leg swelling is improved. She denies fever, chills, or diarrhea. She is using her other medications without relief. She cannot drink water, because it causes vomiting.  Patient is a 56 y.o. female presenting with vomiting, abdominal pain, and back pain. The history is provided by the patient.  Emesis  Associated symptoms include abdominal pain.  Abdominal Pain The primary symptoms of the illness include abdominal pain and vomiting.  Additional symptoms associated with the illness include back pain.  Back Pain  Associated symptoms include abdominal pain.    Past Medical History  Diagnosis Date  . 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 Degeneration of lumbar or lumbosacral intervertebral disc   . Dermatophytosis of the body   . Chronic pain syndrome   . Edema   . Insomnia, unspecified   . Unspecified hypertrophic and atrophic condition of skin   . Cerebral aneurysm, nonruptured   . Unspecified hereditary and idiopathic peripheral neuropathy   . Tobacco use disorder   . Unspecified urinary incontinence   . Migraine, unspecified, without mention of intractable migraine without mention of status migrainosus   . Urinary tract infection, site not specified   . Carpal tunnel syndrome   .  Unspecified sleep apnea     NPSG 01-09-09 AHI 6.2, RDI 13  . Restless legs syndrome (RLS)   . Myalgia and myositis, unspecified   . Arthropathy, unspecified, site unspecified   . Peptic ulcer, unspecified site, unspecified as acute or chronic, without mention of hemorrhage, perforation, or obstruction   . Bronchitis, not specified as acute or chronic   . Osteoarthrosis, unspecified whether generalized or localized, unspecified site   . Unspecified essential hypertension   . Other and unspecified hyperlipidemia   . Esophageal reflux   . Type II or unspecified type diabetes mellitus without mention of complication, not stated as uncontrolled   . Depressive disorder, not elsewhere classified   . Unspecified asthma   . Anxiety state, unspecified   . Allergic rhinitis, cause unspecified     Skin Test POS 06-26-09  . Obesity   . Cirrhosis   . Colon polyp   . Anemia     Past Surgical History  Procedure Date  . Bladder lift 1993  . Cerebreal anuerysm 2008  . Tonsillectomy age 23  . Laproscopic surgery abdomen for intestine infection 1984  . Carpal tunnel release 1996 2009    x 2 bilateral  . Partial hysterectomy age 59    unlcear why this was done  . Colonoscopy   . Total knee arthroplasty     right  . Clavicle surgery     fracture    Family History  Problem Relation Age of Onset  . Alzheimer's disease Father     CVA  . Uterine cancer  Mother   . Lung cancer Mother   . COPD Mother   . Heart disease Mother   . Heart disease Maternal Aunt     x 3 aunt  . Ovarian cancer Maternal Aunt   . Breast cancer Maternal Aunt   . Colon polyps Mother   . Diabetes Mother   . Diabetes Father   . Irritable bowel syndrome Mother     maternal aunts x 2    History  Substance Use Topics  . Smoking status: Current Everyday Smoker -- 0.5 packs/day    Types: Cigarettes  . Smokeless tobacco: Never Used   Comment: Using patches  . Alcohol Use: Yes     rarely- glass on wine    OB History     Grav Para Term Preterm Abortions TAB SAB Ect Mult Living                  Review of Systems  Gastrointestinal: Positive for vomiting and abdominal pain.  Musculoskeletal: Positive for back pain.  All other systems reviewed and are negative.    Allergies  Bupropion hcl; Erythromycin; Haloperidol lactate; Latex; and Neomycin-bacitracin zn-polymyx  Home Medications   Current Outpatient Rx  Name Route Sig Dispense Refill  . ALBUTEROL SULFATE HFA 108 (90 BASE) MCG/ACT IN AERS Inhalation Inhale 2 puffs into the lungs 4 (four) times daily.      Marland Kitchen BENZTROPINE MESYLATE 1 MG PO TABS Oral Take 1 mg by mouth 2 (two) times daily.    Marland Kitchen CALCIUM 600+D PO Oral Take 3 tablets by mouth daily.    Marland Kitchen DIHYDROERGOTAMINE MESYLATE 4 MG/ML NA SOLN Nasal Place 1 spray into the nose as needed. Use in one nostril as directed.  No more than 4 sprays in one hour for migraines.    . DULOXETINE HCL 30 MG PO CPEP Oral Take 30 mg by mouth 3 (three) times daily.    Marland Kitchen FERROUS SULFATE 325 (65 FE) MG PO TABS Oral Take 325 mg by mouth daily with breakfast.    . GABAPENTIN 400 MG PO CAPS Oral Take 400 mg by mouth 3 (three) times daily.     Marland Kitchen HYDROXYZINE PAMOATE 50 MG PO CAPS Oral Take 100 mg by mouth daily.     Marland Kitchen MECLIZINE HCL 25 MG PO TABS Oral Take 25 mg by mouth 3 (three) times daily as needed. For dizziness.    Marland Kitchen MELATONIN 5 MG PO TABS Oral Take 1 tablet by mouth at bedtime as needed. For sleep.    . METHOCARBAMOL 500 MG PO TABS Oral Take 500 mg by mouth 4 (four) times daily.    . ADULT MULTIVITAMIN W/MINERALS CH Oral Take 1 tablet by mouth daily.    . OXYCODONE HCL 30 MG PO TABS Oral Take 30 mg by mouth every 6 (six) hours as needed. For pain.    Marland Kitchen PRESCRIPTION MEDICATION Subcutaneous Inject 1 Syringe into the skin daily as needed. For migraines.  DHE 45    . ROPINIROLE HCL 5 MG PO TABS Oral Take 5 mg by mouth at bedtime as needed. For RLS.    . TOPIRAMATE 200 MG PO TABS Oral Take 200 mg by mouth daily.     .  TRAZODONE HCL 150 MG PO TABS Oral Take 150 mg by mouth at bedtime.    Marland Kitchen ONDANSETRON HCL 8 MG PO TABS Oral Take 1 tablet (8 mg total) by mouth every 8 (eight) hours as needed for nausea. 20 tablet 0  . POTASSIUM  CHLORIDE CRYS ER 20 MEQ PO TBCR Oral Take 1 tablet (20 mEq total) by mouth 2 (two) times daily. 14 tablet 0    BP 142/72  Pulse 89  Temp 98.6 F (37 C)  Resp 20  SpO2 99%  Physical Exam  Vitals reviewed. Constitutional: She is oriented to person, place, and time. She appears well-developed.       Frail, cooperative  HENT:  Head: Normocephalic and atraumatic.       Mucous membranes are dry  Eyes: Conjunctivae and EOM are normal. Pupils are equal, round, and reactive to light.  Neck: Normal range of motion and phonation normal. Neck supple.  Cardiovascular: Normal rate and intact distal pulses.        Tachycardic  Pulmonary/Chest: Effort normal and breath sounds normal. She exhibits no tenderness.  Abdominal: Soft. She exhibits no distension. There is tenderness (Epigastric, mild). There is no guarding.  Musculoskeletal: Normal range of motion.  Neurological: She is alert and oriented to person, place, and time. She has normal strength. She exhibits normal muscle tone.       Dystonia and rhythmic hand tremor, consistent with tardive dyskinesia  Skin: Skin is warm and dry.  Psychiatric:       Anxious    ED Course  Procedures (including critical care time)  Emergency department treatment: IV fluids, IV, Zofran, IV, Dilaudid  Reevaluation: 11:40- she is improved. She is tolerating oral water and taking potassium orally.  Repeat vital signs are normal.    Labs Reviewed  URINALYSIS, ROUTINE W REFLEX MICROSCOPIC - Abnormal; Notable for the following:    APPearance HAZY (*)     Ketones, ur 15 (*)     All other components within normal limits  CBC WITH DIFFERENTIAL - Abnormal; Notable for the following:    Platelets 83 (*)     All other components within normal limits    COMPREHENSIVE METABOLIC PANEL - Abnormal; Notable for the following:    Potassium 2.7 (*)     CO2 15 (*)     Glucose, Bld 180 (*)     Total Bilirubin 1.3 (*)     GFR calc non Af Amer 74 (*)     GFR calc Af Amer 86 (*)     All other components within normal limits  LIPASE, BLOOD  URINE CULTURE   Dg Abd Acute W/chest  01/17/2012  *RADIOLOGY REPORT*  Clinical Data: Vomiting, diarrhea, weakness  ACUTE ABDOMEN SERIES (ABDOMEN 2 VIEW & CHEST 1 VIEW)  Comparison: Ultrasound of the abdomen of 10/30/2011 and CT abdomen of 10/06/2008, chest x-ray of 12/07/2010  Findings: No active infiltrate or effusion is seen.  A probable granuloma in the right mid lung is stable.  There is mild peribronchial thickening present.  The heart is mildly enlarged.  Supine and left lateral decubitus films of the abdomen were obtained.  There is a paucity of bowel gas throughout the abdomen. No definite bowel obstruction is seen.  On the decubitus images there is no evidence of free intraperitoneal air.  Neurostimulator electrodes are noted.  IMPRESSION:  1.  Cardiomegaly.  Mild peribronchial thickening. 2.  No bowel obstruction.  Paucity of bowel gas.  No free air.  Original Report Authenticated By: Juline Patch, M.D.     1. Back pain   2. Vomiting   3. Hypokalemia       MDM  Abdominal and back pain,  After running out of pain medicine. Hypokalemia is likely due to diuretic usage.  Doubt metabolic instability, serious bacterial infection or impending vascular collapse; the patient is stable for discharge.    Plan: Home Medications- Zofran; Home Treatments- gradually advance diet; Recommended follow up- PCP 1 week       Flint Melter, MD 01/17/12 1635

## 2012-01-21 ENCOUNTER — Telehealth: Payer: Self-pay | Admitting: Internal Medicine

## 2012-01-21 NOTE — Telephone Encounter (Signed)
I told him that I would likely be able to see [name]. However, I need to review ALL of his outside GI records prior. If those could be obtained and placed in my office, I will review them. We will go from there. Thanks

## 2012-01-21 NOTE — Telephone Encounter (Signed)
Records placed on Dr. Perry's desk for review.  

## 2012-01-21 NOTE — Telephone Encounter (Signed)
Dr. Marina Goodell please advise regarding appt for pts husband [name] Fung.

## 2012-01-21 NOTE — Telephone Encounter (Signed)
Error

## 2012-01-23 NOTE — Telephone Encounter (Signed)
[name] asked me to consider taking on her husband is a patient.They were kind enough to forward his records from our review.After spending several hours reviewing [name] outside records, I feel that his problems are of such a complex nature that he requires tertiary care. I called [name] personally, by telephone, today. I wanted to possibly save them the time and expense of an office visit hearing [name] (though I told him that they were welcome to come). I explained that he has many significant medical problems that are not GI in nature.I explained that it appears that His primary GI problem is severe gastroparesis which may be secondary to diabetes, his medications, or combination. I recommended that they speak to their primary care physician regarding a referral to [name]. They have expertise in gastric motility disorders. They both were appreciated of of my time and recommendation.

## 2012-02-06 ENCOUNTER — Emergency Department (HOSPITAL_COMMUNITY): Payer: Medicare Other

## 2012-02-06 ENCOUNTER — Encounter (HOSPITAL_COMMUNITY): Payer: Self-pay | Admitting: Emergency Medicine

## 2012-02-06 ENCOUNTER — Emergency Department (HOSPITAL_COMMUNITY)
Admission: EM | Admit: 2012-02-06 | Discharge: 2012-02-06 | Disposition: A | Discharge status: Home / Self Care | Payer: Medicare Other | Attending: Emergency Medicine | Admitting: Emergency Medicine

## 2012-02-06 DIAGNOSIS — W19XXXA Unspecified fall, initial encounter: Secondary | ICD-10-CM

## 2012-02-06 DIAGNOSIS — S9031XA Contusion of right foot, initial encounter: Secondary | ICD-10-CM

## 2012-02-06 DIAGNOSIS — Y9210 Unspecified residential institution as the place of occurrence of the external cause: Secondary | ICD-10-CM | POA: Insufficient documentation

## 2012-02-06 DIAGNOSIS — S93409A Sprain of unspecified ligament of unspecified ankle, initial encounter: Secondary | ICD-10-CM | POA: Insufficient documentation

## 2012-02-06 DIAGNOSIS — W010XXA Fall on same level from slipping, tripping and stumbling without subsequent striking against object, initial encounter: Secondary | ICD-10-CM | POA: Insufficient documentation

## 2012-02-06 DIAGNOSIS — Z79899 Other long term (current) drug therapy: Secondary | ICD-10-CM | POA: Insufficient documentation

## 2012-02-06 DIAGNOSIS — G894 Chronic pain syndrome: Secondary | ICD-10-CM | POA: Insufficient documentation

## 2012-02-06 DIAGNOSIS — S8001XA Contusion of right knee, initial encounter: Secondary | ICD-10-CM

## 2012-02-06 DIAGNOSIS — E119 Type 2 diabetes mellitus without complications: Secondary | ICD-10-CM | POA: Insufficient documentation

## 2012-02-06 DIAGNOSIS — S93401A Sprain of unspecified ligament of right ankle, initial encounter: Secondary | ICD-10-CM

## 2012-02-06 DIAGNOSIS — I10 Essential (primary) hypertension: Secondary | ICD-10-CM | POA: Insufficient documentation

## 2012-02-06 DIAGNOSIS — S8000XA Contusion of unspecified knee, initial encounter: Secondary | ICD-10-CM | POA: Insufficient documentation

## 2012-02-06 DIAGNOSIS — J45909 Unspecified asthma, uncomplicated: Secondary | ICD-10-CM | POA: Insufficient documentation

## 2012-02-06 DIAGNOSIS — S8002XA Contusion of left knee, initial encounter: Secondary | ICD-10-CM

## 2012-02-06 DIAGNOSIS — S9030XA Contusion of unspecified foot, initial encounter: Secondary | ICD-10-CM | POA: Insufficient documentation

## 2012-02-06 MED ORDER — KETOROLAC TROMETHAMINE 60 MG/2ML IM SOLN
60.0000 mg | Freq: Once | INTRAMUSCULAR | Status: AC
  Administered 2012-02-06: 60 mg via INTRAMUSCULAR
  Filled 2012-02-06: qty 2

## 2012-02-06 MED ORDER — HYDROCODONE-ACETAMINOPHEN 5-325 MG PO TABS
2.0000 | ORAL_TABLET | Freq: Once | ORAL | Status: AC
  Administered 2012-02-06: 2 via ORAL
  Filled 2012-02-06: qty 2

## 2012-02-06 NOTE — ED Notes (Signed)
Pt requesting to see MD. MD aware

## 2012-02-06 NOTE — ED Notes (Signed)
[name] while visiting husband and hurt her rt foot and knee ( she has had knee replacement ) last pain at 6am  Has speech issues any way

## 2012-02-06 NOTE — ED Provider Notes (Signed)
History  Scribed for Ward Givens, MD, the patient was seen in room TR09C/TR09C. This chart was scribed by Candelaria Stagers. The patient's care started at 12:54 PM   CSN: [id number]  Arrival date & time 02/06/12  1049   First MD Initiated Contact with Patient 02/06/12 1225      Chief Complaint  Patient presents with  . Fall     The history is provided by the patient. No language interpreter was used.   [name] is a 56 y.o. female who presents to the Emergency Department complaining of right foot and knee pain after losing her balance and falling forward today while using her walker while visiting husband in the hospital.  She reports he was in the [facility] and she was using her walker and she was exhausted by the time she got to his room and lost her balance and fell down onto her knees. She reports that she twisted her right ankle and fell on her knees.  She denies hitting her head, syncope, neck pain, or back pain different from baseline.  Pt has h/o right knee replacement. She is to see her orthopedist next month to discuss TKR on the left. Had a torn meniscus that they were waiting for it to heal.  Pt has h/o cerebreal anuerysm which has caused some speech difficulty.  Pt smokes.   PCP is Dr. Darnelle Spangle [facility]  Past Medical History  Diagnosis Date  . 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 Degeneration of lumbar or lumbosacral intervertebral disc   . Dermatophytosis of the body   . Chronic pain syndrome   . Edema   . Insomnia, unspecified   . Unspecified hypertrophic and atrophic condition of skin   . Cerebral aneurysm, nonruptured   . Unspecified hereditary and idiopathic peripheral neuropathy   . Tobacco use disorder   . Unspecified urinary incontinence   . Migraine, unspecified, without mention of intractable migraine without mention of status migrainosus   . Urinary tract infection, site not specified   . Carpal tunnel syndrome   .  Unspecified sleep apnea     NPSG 01-09-09 AHI 6.2, RDI 13  . Restless legs syndrome (RLS)   . Myalgia and myositis, unspecified   . Arthropathy, unspecified, site unspecified   . Peptic ulcer, unspecified site, unspecified as acute or chronic, without mention of hemorrhage, perforation, or obstruction   . Bronchitis, not specified as acute or chronic   . Osteoarthrosis, unspecified whether generalized or localized, unspecified site   . Unspecified essential hypertension   . Other and unspecified hyperlipidemia   . Esophageal reflux   . Type II or unspecified type diabetes mellitus without mention of complication, not stated as uncontrolled   . Depressive disorder, not elsewhere classified   . Unspecified asthma   . Anxiety state, unspecified   . Allergic rhinitis, cause unspecified     Skin Test POS 06-26-09  . Obesity   . Cirrhosis   . Colon polyp   . Anemia     Past Surgical History  Procedure Date  . Bladder lift 1993  . Cerebreal anuerysm 2008  . Tonsillectomy age 63  . Laproscopic surgery abdomen for intestine infection 1984  . Carpal tunnel release 1996 2009    x 2 bilateral  . Partial hysterectomy age 12    unlcear why this was done  . Colonoscopy   . Total knee arthroplasty     right  . Clavicle surgery  fracture    Family History  Problem Relation Age of Onset  . Alzheimer's disease Father     CVA  . Uterine cancer Mother   . Lung cancer Mother   . COPD Mother   . Heart disease Mother   . Heart disease Maternal Aunt     x 3 aunt  . Ovarian cancer Maternal Aunt   . Breast cancer Maternal Aunt   . Colon polyps Mother   . Diabetes Mother   . Diabetes Father   . Irritable bowel syndrome Mother     maternal aunts x 2    History  Substance Use Topics  . Smoking status: Current Everyday Smoker -- 0.5 packs/day    Types: Cigarettes  . Smokeless tobacco: Never Used   Comment: Using patches  . Alcohol Use: Yes     rarely- glass on wine  Lives at  home Lives with spouse  OB History    Grav Para Term Preterm Abortions TAB SAB Ect Mult Living                  Review of Systems  HENT: Negative for neck pain.   Musculoskeletal: Positive for arthralgias (knee pain bilaterally, right foot pain). Negative for back pain (unchanged from baseline).  Neurological: Negative for syncope.  All other systems reviewed and are negative.    Allergies  Bupropion hcl; Erythromycin; Haloperidol lactate; Latex; and Neomycin-bacitracin zn-polymyx  Home Medications   Current Outpatient Rx  Name Route Sig Dispense Refill  . ALBUTEROL SULFATE HFA 108 (90 BASE) MCG/ACT IN AERS Inhalation Inhale 2 puffs into the lungs every 4 (four) hours as needed. For shortness of breath    . BENZTROPINE MESYLATE 1 MG PO TABS Oral Take 1 mg by mouth 2 (two) times daily.    Marland Kitchen CALCIUM 600+D PO Oral Take 3 tablets by mouth daily.    Marland Kitchen DIHYDROERGOTAMINE MESYLATE 4 MG/ML NA SOLN Nasal Place 1 spray into the nose as needed. Use in one nostril as directed.  No more than 4 sprays in one hour for migraines.    . DULOXETINE HCL 30 MG PO CPEP Oral Take 30 mg by mouth 3 (three) times daily.    Marland Kitchen FERROUS SULFATE 325 (65 FE) MG PO TABS Oral Take 325 mg by mouth daily with breakfast.    . GABAPENTIN 400 MG PO CAPS Oral Take 400 mg by mouth 3 (three) times daily.     Marland Kitchen HYDROXYZINE PAMOATE 50 MG PO CAPS Oral Take 100 mg by mouth daily.     Marland Kitchen MECLIZINE HCL 25 MG PO TABS Oral Take 25 mg by mouth 3 (three) times daily as needed. For dizziness.    Marland Kitchen MELATONIN 5 MG PO TABS Oral Take 1 tablet by mouth at bedtime as needed. For sleep.    . METHOCARBAMOL 500 MG PO TABS Oral Take 500 mg by mouth 3 (three) times daily.     . ADULT MULTIVITAMIN W/MINERALS CH Oral Take 1 tablet by mouth daily.    Marland Kitchen ONDANSETRON HCL 4 MG PO TABS Oral Take 4 mg by mouth every 8 (eight) hours as needed.     . OXYCODONE HCL 30 MG PO TABS Oral Take 30 mg by mouth every 6 (six) hours as needed. For pain.    Marland Kitchen  POTASSIUM CHLORIDE CRYS ER 20 MEQ PO TBCR Oral Take 1 tablet (20 mEq total) by mouth 2 (two) times daily. 14 tablet 0  . ROPINIROLE HCL 5 MG PO TABS  Oral Take 5 mg by mouth at bedtime as needed. For RLS.    . TOPIRAMATE 200 MG PO TABS Oral Take 200 mg by mouth daily.     . TRAZODONE HCL 150 MG PO TABS Oral Take 150 mg by mouth at bedtime.    Marland Kitchen PRESCRIPTION MEDICATION Subcutaneous Inject 1 Syringe into the skin daily as needed. For migraines.  DHE 45      BP 145/78  Pulse 92  Temp 99 F (37.2 C)  Resp 20  SpO2 98%  Vital signs normal    Physical Exam  Nursing note and vitals reviewed. Constitutional: She is oriented to person, place, and time. She appears well-developed and well-nourished. No distress.  HENT:  Head: Normocephalic and atraumatic.  Right Ear: External ear normal.  Left Ear: External ear normal.  Nose: Nose normal.  Mouth/Throat: Oropharynx is clear and moist.  Eyes: Conjunctivae and EOM are normal. Pupils are equal, round, and reactive to light.  Neck: Normal range of motion. Neck supple.  Pulmonary/Chest: Effort normal. No respiratory distress.  Musculoskeletal: She exhibits tenderness.       Right knee with well healed midline scar consistent with knee replacement  Swelling in the joint.  Flexion and extension painful.  Faint bruising starting on ? Medial aspect of anterior knee about size of half a penny.  Tender over both malleoli of the right foot, no obvious swelling or bruising.  Tenderness over dorsum of lateral foot.   Left knee has no obvious bruising or abrasions.  Minimal amount of swelling of the joint.    Neurological: She is alert and oriented to person, place, and time.  Skin: Skin is warm and dry. She is not diaphoretic.  Psychiatric: She has a normal mood and affect. Her behavior is normal.    ED Course  Procedures    Medications  HYDROcodone-acetaminophen (NORCO/VICODIN) 5-325 MG per tablet 2 tablet (2 tablet Oral Given 02/06/12 1311)   ketorolac (TORADOL) injection 60 mg (60 mg Intramuscular Given 02/06/12 1309)    DIAGNOSTIC STUDIES: Oxygen Saturation is 98% on room air, normal by my interpretation.    COORDINATION OF CARE:  13:02 Ordered: DG Knee Complete 4 Views Right; DG Knee Complete 4 Views Left; DG Ankle Complete Right; DG Foot Complete Right  Pt placed in ASO on her right ankle  3:03 PM Recheck: Discussed image results with pt including course of care.   Review of [facility] site shows patient gets #120 oxycodone 10 mg tabs monthly, the last was 8/9  Dg Ankle Complete Right  02/06/2012  *RADIOLOGY REPORT*  Clinical Data: Fall, pain  RIGHT ANKLE - COMPLETE 3+ VIEW  Comparison: None.  Findings: Mild degenerative change at the tibiotalar joint.  Mild soft tissue swelling medially and laterally.  Plantar heel spur. No fracture or dislocation.  IMPRESSION: As above.   Original Report Authenticated By: Elsie Stain, M.D.    Dg Knee Complete 4 Views Left  02/06/2012  *RADIOLOGY REPORT*  Clinical Data: Larey Seat, pain  LEFT KNEE - COMPLETE 4+ VIEW  Comparison: None.  Findings: Mild to moderate tricompartmental degenerative change. No fracture or effusion.  Osteopenia.  IMPRESSION: No acute findings.   Original Report Authenticated By: Elsie Stain, M.D.    Dg Knee Complete 4 Views Right  02/06/2012  *RADIOLOGY REPORT*  Clinical Data: Larey Seat, pain  RIGHT KNEE - COMPLETE 4+ VIEW  Comparison: 08/12/2008  Findings: Right TKR satisfactory appearance.  No loosening or fracture.  No effusion.  IMPRESSION: As  above.   Original Report Authenticated By: Elsie Stain, M.D.    Dg Foot Complete Right  02/06/2012  *RADIOLOGY REPORT*  Clinical Data: Larey Seat, pain  RIGHT FOOT COMPLETE - 3+ VIEW  Comparison: None.  Findings: Mild degenerative change in the midfoot.  No fracture or dislocation.  Mild osteopenia.  Plantar heel spur.  IMPRESSION: As above.   Original Report Authenticated By: Elsie Stain, M.D.      1. Fall   2. Contusion of  knee, left   3. Contusion of knee, right   4. Sprain of right ankle   5. Contusion of foot, right     Plan discharge  Devoria Albe, MD, FACEP   MDM    I personally performed the services described in this documentation, which was scribed in my presence. The recorded information has been reviewed and considered.  Devoria Albe, MD, Armando Gang        Ward Givens, MD 02/06/12 [phone]

## 2012-02-06 NOTE — Progress Notes (Signed)
Orthopedic Tech Progress Note Patient Details:  [name] 1955/10/22 [id number]  Ortho Devices Type of Ortho Device: ASO Ortho Device/Splint Location: (R) LE Ortho Device/Splint Interventions: Application   Jennye Moccasin 02/06/2012, 2:32 PM

## 2012-02-06 NOTE — ED Notes (Signed)
Patient transported to X-ray 

## 2012-02-06 NOTE — ED Notes (Signed)
MD at bedside. 

## 2012-02-06 NOTE — ED Notes (Signed)
Ortho at bedside.

## 2012-02-06 NOTE — ED Notes (Signed)
Ortho paged. 

## 2012-02-06 NOTE — ED Notes (Signed)
Patient transported from X-ray 

## 2012-02-18 ENCOUNTER — Ambulatory Visit: Payer: Medicare Other | Admitting: Internal Medicine

## 2012-02-20 ENCOUNTER — Ambulatory Visit: Payer: Medicare Other | Admitting: Internal Medicine

## 2012-03-02 ENCOUNTER — Telehealth: Payer: Self-pay | Admitting: Internal Medicine

## 2012-03-02 DIAGNOSIS — G4733 Obstructive sleep apnea (adult) (pediatric): Secondary | ICD-10-CM

## 2012-03-02 NOTE — Telephone Encounter (Signed)
Called, spoke with pt.  She is scheduled to see a Neuro Sleep Specialist on Friday d/t her sleep patterns as recommended by PCP and neuro dr.  Andrey Cota she has lost over 100 lbs in 9 months.  It is uncomfortable to wear cpap now.  Does not feel pressure is right.  She was last seen by Dr. Maple Hudson on 11/20/10 and asked to f/u in 1 year.  She does have a pending appt with him on 04/09/12.  She would like to know if she should have a sleep study prior to appt with Dr. Maple Hudson.  Pls advise.  Thank you.  Pt aware CDY out of office this evening and ok with call back tomorrow.  Cell phone is [phone]

## 2012-03-02 NOTE — Telephone Encounter (Signed)
I last saw her a year ago for OSA, so she is due and has appointment with me 04/09/12. I don't know that she needs to see me and a "Neuro Sleep Specialist" (Dr Dohmeier or somebody else?)  Since she has lost a lot of weight, we can go ahead and order a new NPSG with split protocol for dx OSA OR she can wait and discuss with the neurologist, depending on who she wants to have follow her sleep problem.

## 2012-03-02 NOTE — Telephone Encounter (Signed)
lmomtcb x1 

## 2012-03-03 NOTE — Telephone Encounter (Signed)
Pt states she is going to be seeing Dr. Lucianne Muss at [facility] but feels more comfortable having the sleep study done by Dr. Maple Hudson and wants to do this at [facility]. Order placed.

## 2012-03-13 ENCOUNTER — Other Ambulatory Visit: Payer: Self-pay

## 2012-03-13 ENCOUNTER — Encounter (HOSPITAL_COMMUNITY): Payer: Self-pay | Admitting: *Deleted

## 2012-03-13 ENCOUNTER — Emergency Department (HOSPITAL_COMMUNITY): Payer: Medicare Other

## 2012-03-13 ENCOUNTER — Emergency Department (HOSPITAL_COMMUNITY)
Admission: EM | Admit: 2012-03-13 | Discharge: 2012-03-14 | Disposition: A | Discharge status: Home / Self Care | Payer: Medicare Other | Attending: Emergency Medicine | Admitting: Emergency Medicine

## 2012-03-13 DIAGNOSIS — M79609 Pain in unspecified limb: Secondary | ICD-10-CM | POA: Insufficient documentation

## 2012-03-13 DIAGNOSIS — R0602 Shortness of breath: Secondary | ICD-10-CM | POA: Insufficient documentation

## 2012-03-13 DIAGNOSIS — F17200 Nicotine dependence, unspecified, uncomplicated: Secondary | ICD-10-CM | POA: Insufficient documentation

## 2012-03-13 DIAGNOSIS — I10 Essential (primary) hypertension: Secondary | ICD-10-CM | POA: Insufficient documentation

## 2012-03-13 DIAGNOSIS — R079 Chest pain, unspecified: Secondary | ICD-10-CM | POA: Insufficient documentation

## 2012-03-13 DIAGNOSIS — Z79899 Other long term (current) drug therapy: Secondary | ICD-10-CM | POA: Insufficient documentation

## 2012-03-13 LAB — DIFFERENTIAL
Basophils Absolute: 0 10*3/uL (ref 0.0–0.1)
Basophils Relative: 1 % (ref 0–1)
Eosinophils Relative: 5 % (ref 0–5)
Monocytes Absolute: 0.6 10*3/uL (ref 0.1–1.0)
Monocytes Relative: 8 % (ref 3–12)

## 2012-03-13 LAB — POCT I-STAT, CHEM 8
BUN: 19 mg/dL (ref 6–23)
Creatinine, Ser: 0.9 mg/dL (ref 0.50–1.10)
Glucose, Bld: 95 mg/dL (ref 70–99)
Hemoglobin: 12.2 g/dL (ref 12.0–15.0)
Potassium: 3.5 mEq/L (ref 3.5–5.1)
Sodium: 143 mEq/L (ref 135–145)

## 2012-03-13 LAB — BASIC METABOLIC PANEL
CO2: 20 mEq/L (ref 19–32)
Calcium: 9.6 mg/dL (ref 8.4–10.5)
Chloride: 108 mEq/L (ref 96–112)
Creatinine, Ser: 0.88 mg/dL (ref 0.50–1.10)
Glucose, Bld: 96 mg/dL (ref 70–99)

## 2012-03-13 LAB — CBC
HCT: 35.3 % — ABNORMAL LOW (ref 36.0–46.0)
Hemoglobin: 12.1 g/dL (ref 12.0–15.0)
MCH: 29.2 pg (ref 26.0–34.0)
MCHC: 34.3 g/dL (ref 30.0–36.0)
MCV: 85.1 fL (ref 78.0–100.0)
RDW: 13.5 % (ref 11.5–15.5)

## 2012-03-13 LAB — POCT I-STAT TROPONIN I

## 2012-03-13 MED ORDER — ASPIRIN 81 MG PO CHEW
324.0000 mg | CHEWABLE_TABLET | Freq: Once | ORAL | Status: AC
  Administered 2012-03-13: 324 mg via ORAL
  Filled 2012-03-13: qty 4

## 2012-03-13 NOTE — ED Notes (Addendum)
Chest pain, nausea,  Pain in shoulders.  Took 2 ntg pta.

## 2012-03-13 NOTE — ED Provider Notes (Signed)
History   This chart was scribed for Donnetta Hutching, MD by Toya Smothers. The patient was seen in room APA14/APA14. Patient's care was started at 2208.  CSN: [id number]  Arrival date & time 03/13/12  2208   First MD Initiated Contact with Patient 03/13/12 2306      Chief Complaint  Patient presents with  . Chest Pain   Patient is a 56 y.o. female presenting with shortness of breath. The history is provided by the patient. No language interpreter was used.  Shortness of Breath  The current episode started today. The problem occurs continuously. The problem has been unchanged. The problem is severe. The symptoms are relieved by one or more prescription drugs. Exacerbated by: stress. Associated symptoms include chest pain and shortness of breath. Pertinent negatives include no fever, no rhinorrhea, no stridor and no cough. There was no intake of a foreign body. Context: after argument. The Heimlich maneuver was not attempted. She has inhaled smoke recently. She has had intermittent steroid use. Her past medical history is significant for asthma, bronchiolitis and past wheezing. Urine output has been normal. There were no sick contacts.    [name] is a 56 y.o. female with a h/o asthma, diabetes mellitus II, HTN, and bronchitus who presents to the Emergency Department complaining of 3 hours of new sudden onset severe constant chest tightness. Pain is described as unlike any before.and radiates to shoulders and arms bilaterally Pt reports onset after increased stress. She took 2 Nitroglycerin pills with mild relief +-. Pt denies syncope, chills, emesis, nausea, rash, and cough.    Past Medical History  Diagnosis Date  . 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 Degeneration of lumbar or lumbosacral intervertebral disc   . Dermatophytosis of the body   . Chronic pain syndrome   . Edema   . Insomnia, unspecified   . Unspecified hypertrophic and atrophic condition of skin    . Cerebral aneurysm, nonruptured   . Unspecified hereditary and idiopathic peripheral neuropathy   . Tobacco use disorder   . Unspecified urinary incontinence   . Migraine, unspecified, without mention of intractable migraine without mention of status migrainosus   . Urinary tract infection, site not specified   . Carpal tunnel syndrome   . Unspecified sleep apnea     NPSG 01-09-09 AHI 6.2, RDI 13  . Restless legs syndrome (RLS)   . Myalgia and myositis, unspecified   . Arthropathy, unspecified, site unspecified   . Peptic ulcer, unspecified site, unspecified as acute or chronic, without mention of hemorrhage, perforation, or obstruction   . Bronchitis, not specified as acute or chronic   . Osteoarthrosis, unspecified whether generalized or localized, unspecified site   . Unspecified essential hypertension   . Other and unspecified hyperlipidemia   . Esophageal reflux   . Type II or unspecified type diabetes mellitus without mention of complication, not stated as uncontrolled   . Depressive disorder, not elsewhere classified   . Unspecified asthma   . Anxiety state, unspecified   . Allergic rhinitis, cause unspecified     Skin Test POS 06-26-09  . Obesity   . Cirrhosis   . Colon polyp   . Anemia     Past Surgical History  Procedure Date  . Bladder lift 1993  . Cerebreal anuerysm 2008  . Tonsillectomy age 32  . Laproscopic surgery abdomen for intestine infection 1984  . Carpal tunnel release 1996 2009    x 2 bilateral  .  Partial hysterectomy age 65    unlcear why this was done  . Colonoscopy   . Total knee arthroplasty     right  . Clavicle surgery     fracture    Family History  Problem Relation Age of Onset  . Alzheimer's disease Father     CVA  . Uterine cancer Mother   . Lung cancer Mother   . COPD Mother   . Heart disease Mother   . Heart disease Maternal Aunt     x 3 aunt  . Ovarian cancer Maternal Aunt   . Breast cancer Maternal Aunt   . Colon polyps  Mother   . Diabetes Mother   . Diabetes Father   . Irritable bowel syndrome Mother     maternal aunts x 2    History  Substance Use Topics  . Smoking status: Current Every Day Smoker -- 0.5 packs/day    Types: Cigarettes  . Smokeless tobacco: Never Used   Comment: Using patches  . Alcohol Use: No     rarely- glass on wine    Review of Systems  Constitutional: Negative for fever.  HENT: Negative for rhinorrhea.   Respiratory: Positive for chest tightness and shortness of breath. Negative for cough and stridor.   Cardiovascular: Positive for chest pain.  Neurological: Negative for tremors, syncope, speech difficulty and weakness.  All other systems reviewed and are negative.    Allergies  Bupropion hcl; Erythromycin; Haloperidol lactate; Latex; Neomycin-bacitracin zn-polymyx; and Tape  Home Medications   Current Outpatient Rx  Name Route Sig Dispense Refill  . ALBUTEROL SULFATE HFA 108 (90 BASE) MCG/ACT IN AERS Inhalation Inhale 2 puffs into the lungs every 4 (four) hours as needed. For shortness of breath    . BENZTROPINE MESYLATE 1 MG PO TABS Oral Take 1 mg by mouth 2 (two) times daily.    Marland Kitchen CALCIUM 600+D PO Oral Take 3 tablets by mouth daily.    Marland Kitchen DIHYDROERGOTAMINE MESYLATE 4 MG/ML NA SOLN Nasal Place 1 spray into the nose as needed. Use in one nostril as directed.  No more than 4 sprays in one hour for migraines.    . DULOXETINE HCL 30 MG PO CPEP Oral Take 30 mg by mouth 3 (three) times daily.    Marland Kitchen FERROUS SULFATE 325 (65 FE) MG PO TABS Oral Take 325 mg by mouth daily with breakfast.    . GABAPENTIN 400 MG PO CAPS Oral Take 400 mg by mouth 3 (three) times daily.     Marland Kitchen HYDROXYZINE PAMOATE 50 MG PO CAPS Oral Take 100 mg by mouth daily.     Marland Kitchen MECLIZINE HCL 25 MG PO TABS Oral Take 25 mg by mouth 3 (three) times daily as needed. For dizziness.    Marland Kitchen MELATONIN 5 MG PO TABS Oral Take 1 tablet by mouth at bedtime as needed. For sleep.    . METHOCARBAMOL 500 MG PO TABS Oral Take  500 mg by mouth 3 (three) times daily.     . ADULT MULTIVITAMIN W/MINERALS CH Oral Take 1 tablet by mouth daily.    Marland Kitchen ONDANSETRON HCL 4 MG PO TABS Oral Take 4 mg by mouth every 8 (eight) hours as needed.     . OXYCODONE HCL 30 MG PO TABS Oral Take 30 mg by mouth every 6 (six) hours as needed. For pain.    Marland Kitchen POTASSIUM CHLORIDE CRYS ER 20 MEQ PO TBCR Oral Take 1 tablet (20 mEq total) by mouth 2 (two) times  daily. 14 tablet 0  . PRESCRIPTION MEDICATION Subcutaneous Inject 1 Syringe into the skin daily as needed. For migraines.  DHE 45    . ROPINIROLE HCL 5 MG PO TABS Oral Take 5 mg by mouth at bedtime as needed. For RLS.    . TOPIRAMATE 200 MG PO TABS Oral Take 200 mg by mouth daily.     . TRAZODONE HCL 150 MG PO TABS Oral Take 150 mg by mouth at bedtime.      BP 135/70  Pulse 61  Temp 98.6 F (37 C) (Oral)  Resp 14  Wt 181 lb (82.101 kg)  SpO2 99%  Physical Exam  Nursing note and vitals reviewed. Constitutional: She is oriented to person, place, and time. She appears well-developed and well-nourished.  HENT:  Head: Normocephalic and atraumatic.  Eyes: Conjunctivae normal and EOM are normal. Pupils are equal, round, and reactive to light.  Neck: Normal range of motion. Neck supple.  Cardiovascular: Normal rate, regular rhythm and normal heart sounds.   Pulmonary/Chest: Effort normal and breath sounds normal.  Abdominal: Soft. Bowel sounds are normal.  Musculoskeletal: Normal range of motion.  Neurological: She is alert and oriented to person, place, and time.  Skin: Skin is warm and dry.  Psychiatric: She has a normal mood and affect.    ED Course  Procedures  DIAGNOSTIC STUDIES: Oxygen Saturation is 99% on room air, normal by my interpretation.    COORDINATION OF CARE: 11:45- Evaluated Pt. Pt is awake and alert. Ordered EKG, CBC w differentials, cardiac monitoring, Comprehensive metabolic panel, and DG chest port.   Labs Reviewed  CBC - Abnormal; Notable for the following:     HCT 35.3 (*)     Platelets 97 (*)     All other components within normal limits  BASIC METABOLIC PANEL - Abnormal; Notable for the following:    Potassium 3.0 (*)     GFR calc non Af Amer 72 (*)     GFR calc Af Amer 84 (*)     All other components within normal limits  DIFFERENTIAL  PROTIME-INR  APTT  POCT I-STAT, CHEM 8  POCT I-STAT TROPONIN I   No results found.   No diagnosis found.   Date: 03/14/2012  Rate: 76  Rhythm: normal sinus rhythm  QRS Axis: normal  Intervals: normal  ST/T Wave abnormalities: normal  Conduction Disutrbances: none  Narrative Interpretation: unremarkable  Dg Chest Portable 1 View  03/13/2012  *RADIOLOGY REPORT*  Clinical Data: Chest and bilateral arm pain.  Smoker.  PORTABLE CHEST - 1 VIEW  Comparison: Previous examinations.  Findings: Stable normal sized heart and clear lungs with mildly prominent interstitial markings.  Stable small calcified granuloma in the right mid lung zone.  Neural stimulator leads overlying the lower thoracic spine.  IMPRESSION:  1.  No acute abnormality. 2.  Stable mild chronic interstitial lung disease compatible with the history of smoking.   Original Report Authenticated By: Darrol Angel, M.D.      MDM  Patient is pain-free now. Screening tests negative. Color is good. Gave her prescription for nitroglycerin. Will return if worse.  Discussed slightly low potassium     I personally performed the services described in this documentation, which was scribed in my presence. The recorded information has been reviewed and considered.    Donnetta Hutching, MD 03/14/12 [phone]

## 2012-03-13 NOTE — ED Notes (Signed)
Patient states that she took 2 of her husbands nitroglycerin at home, states she is not prescribed the medication.  States that the pain was so bad in her chest, she did not care if she lived or died.

## 2012-03-13 NOTE — ED Notes (Addendum)
States she had an argument at home and started having chest pain.  States she is having pain in her left shoulder and left neck, states she is having nausea, no vomiting.  States she is having shortness of breath.

## 2012-03-13 NOTE — ED Notes (Signed)
Sitting quietly, states that her pain comes and goes in intensity, mainly points to her left shoulder and left neck when talking about her chest pain and states that her lower back pain is killing her as well.  States she has a long standing history of medical problems regarding her back and pain.

## 2012-03-14 MED ORDER — NITROGLYCERIN 0.4 MG SL SUBL: 0.4000 mg | SUBLINGUAL_TABLET | SUBLINGUAL | Status: AC | PRN

## 2012-03-14 MED ORDER — POTASSIUM CHLORIDE 20 MEQ PO PACK
40.0000 meq | PACK | Freq: Once | ORAL | Status: AC
  Administered 2012-03-14: 40 meq via ORAL
  Filled 2012-03-14: qty 2

## 2012-03-23 ENCOUNTER — Telehealth: Payer: Self-pay | Admitting: Internal Medicine

## 2012-03-23 NOTE — Telephone Encounter (Signed)
Pt called and states that she had to stop taking her Oxycodone because her nephew is living with them and he is an addict. Per pt the pain specialist will not give her anything else for pain due to the nephew living with them. Pt was told to take Tylenol and Ibuprofen for pain. Pt wants to know what OTC med Dr. Marina Goodell would suggest she take since she has cirrhosis. Dr. Marina Goodell please advise.

## 2012-03-24 NOTE — Telephone Encounter (Signed)
Spoke with pts husband and let him know that she can take Tylenol. Husband states that he and the pt were relying on Oxycodone and they wanted to stop taking it so they just quit taking it. Pain clinic doc wanted them to go for counseling and they do not feel they need to do that. Husband states they want to have their PCP handle their pain. He wanted to know what type of prescription pain medicine his wife could take that is not as strong as the oxycodone. Let husband know that they could have their PCP call and speak directly with Dr. Marina Goodell for advice.

## 2012-03-24 NOTE — Telephone Encounter (Signed)
Avoid NSAIDS. Tylenol in therapeutic doses only is ok

## 2012-03-26 ENCOUNTER — Ambulatory Visit (HOSPITAL_BASED_OUTPATIENT_CLINIC_OR_DEPARTMENT_OTHER): Payer: Medicare Other | Attending: Internal Medicine | Admitting: Radiology

## 2012-03-26 VITALS — Ht 64.0 in | Wt 175.0 lb

## 2012-03-26 DIAGNOSIS — G4710 Hypersomnia, unspecified: Secondary | ICD-10-CM | POA: Insufficient documentation

## 2012-03-26 DIAGNOSIS — G4733 Obstructive sleep apnea (adult) (pediatric): Secondary | ICD-10-CM

## 2012-03-28 DIAGNOSIS — G4730 Sleep apnea, unspecified: Secondary | ICD-10-CM

## 2012-03-28 DIAGNOSIS — G4710 Hypersomnia, unspecified: Secondary | ICD-10-CM

## 2012-03-28 NOTE — Procedures (Signed)
NAME:  [name], [name]                ACCOUNT NO.:  [id number]  MEDICAL RECORD NO.:  [id number]          PATIENT TYPE:  OUT  LOCATION:  SLEEP CENTER                 FACILITY:  [facility] [address]  PHYSICIAN:  Deborahann Poteat D. Maple Hudson, MD, FCCP, FACPDATE OF BIRTH:  Sep 26[birth year] 1954  DATE OF STUDY:  03/26/2012                           NOCTURNAL POLYSOMNOGRAM  REFERRING PHYSICIAN:  Darlys Buis D. Maple Hudson, MD, FCCP, FACP  REFERRING PHYSICIAN:  Katura Eatherly D. Leocadio Heal, MD, FCCP, FACP  INDICATION FOR STUDY:  Hypersomnia with sleep apnea.  EPWORTH SLEEPINESS SCORE:  24/24.  BMI 30, weight 175 pounds, height 64 inches, neck 14 inches.  MEDICATIONS:  Home medications are charted and reviewed.  A previous diagnostic NPSG on January 09, 2009 had recorded an AHI of 6.2 per hour.  Body weight for that study was 230 pounds.  SLEEP ARCHITECTURE:  Total sleep time 348 minutes with sleep efficiency 91.6%.  Stage I was 6.8%, stage II 87.4%, stage III absent, REM 5.9% of total sleep time.  Sleep latency 0 (immediate sleep onset), REM latency 246.5 minutes.  Awake after sleep onset 31.5 minutes.  Arousal index 8.8.  Bedtime medication:  Benztropine, calcium carbonate with vitamin D, Cymbalta, gabapentin, hydroxyzine, ReQuip, Topamax, tramadol all taken at 20:45 p.m.  Tramadol was repeated for pain at 00:15 a.m.  RESPIRATORY DATA:  Apnea/hypopnea index (AHI) 0.2 per hour.  A single hypopnea was recorded while supine.  CPAP titration was not indicated.  OXYGEN DATA:  Mild-to-moderate snoring with oxygen desaturation to a nadir of 89% and mean oxygen saturation through the study of 95% on room air.  CARDIAC DATA:  Sinus rhythm with rare PAC.  MOVEMENT/PARASOMNIA:  She began the night sleeping on a standard pillow but because of back pain, changed during the night to sleep on a wedge. A total of 78 limb jerks were counted, of which 8 were associated with arousal or awakening for periodic limb movement with arousal index of 1.4 per hour.   Bathroom x2.  IMPRESSION/RECOMMENDATION: 1. Rare respiratory event with sleep disturbance, within normal     limits.  AHI 0.2 per hour (the normal range for adults is from 0-5     events per hour).  Mild-to-moderate snoring with oxygen     desaturation to a nadir of 89% and mean oxygen saturation through     the study of 95% on room air. 2. An original diagnostic study on January 17, 2009 had recorded an AHI     of 6.2 per hour.  Body weight for that study was 230 pounds,     reflecting significant weight loss since her original study. 3. Significantly sedating medications taken at bedtime, as listed     above.  These are likely to contribute to daytime sleepiness as     well.     [name], MD, [facility], FACP Diplomate, American Board of Sleep Medicine    CDY/MEDQ  D:  03/28/2012 12:20:52  T:  03/28/2012 13:42:48  Job:  [id number]

## 2012-04-03 ENCOUNTER — Ambulatory Visit: Payer: Medicare Other | Admitting: Internal Medicine

## 2012-04-06 ENCOUNTER — Telehealth: Payer: Self-pay | Admitting: Internal Medicine

## 2012-04-06 NOTE — Telephone Encounter (Signed)
Sleep study faxed to Dr. Lucianne Muss at [phone]. [id number] to make sure this is the correct MD.Nicki Gracy Yancey Flemings, CMA

## 2012-04-07 NOTE — Telephone Encounter (Signed)
Spoke with the pt and confirmed doctors name is Dr. Lucianne Muss at [facility], sleep neurologist.

## 2012-04-09 ENCOUNTER — Ambulatory Visit: Payer: Medicare Other | Admitting: Internal Medicine

## 2012-04-20 ENCOUNTER — Telehealth: Payer: Self-pay | Admitting: Internal Medicine

## 2012-04-20 NOTE — Telephone Encounter (Signed)
Please advise PSG results Pt last seen 11/20/10 Cancelled last 2 appts and does not have ov pending

## 2012-04-21 NOTE — Telephone Encounter (Signed)
Per CY-we dont measure BP during sleep. It may have been heart rate or O2 saturation that dropped. Suggest look at BP in a week with PCP.

## 2012-04-21 NOTE — Telephone Encounter (Signed)
Pls advise of sleep study results or does pt need an ov.

## 2012-04-21 NOTE — Telephone Encounter (Signed)
Spoke with pt and notified of recs per CDY She verbalized understanding and states nothing further needed 

## 2012-04-21 NOTE — Telephone Encounter (Signed)
Called, spoke with pt.  Informed her of below per Dr. Maple Hudson. She verbalized understanding.  States her BP was "dropping terribly."  States it would go to 50/22 then she would fall asleep.  Pt states during the sleep study the gentleman told her that her BP did drop during the study and states she was told he was "almost going to wake me up" because of it.  She would like to know if the sleep study shows her BP readings or her BP dropping.  Dr. Maple Hudson, pls advise.  Thank you.

## 2012-04-21 NOTE — Telephone Encounter (Signed)
Sleep study normal. She does not have sleep apnea. Significantly sedating medications she takes are likely reason for day-time sleepiness.

## 2012-04-24 DIAGNOSIS — G2581 Restless legs syndrome: Secondary | ICD-10-CM | POA: Insufficient documentation

## 2012-04-24 DIAGNOSIS — G8194 Hemiplegia, unspecified affecting left nondominant side: Secondary | ICD-10-CM | POA: Insufficient documentation

## 2012-04-24 DIAGNOSIS — Z9889 Other specified postprocedural states: Secondary | ICD-10-CM | POA: Insufficient documentation

## 2012-04-24 DIAGNOSIS — I607 Nontraumatic subarachnoid hemorrhage from unspecified intracranial artery: Secondary | ICD-10-CM | POA: Insufficient documentation

## 2012-04-24 DIAGNOSIS — R4189 Other symptoms and signs involving cognitive functions and awareness: Secondary | ICD-10-CM | POA: Insufficient documentation

## 2012-05-06 DIAGNOSIS — Z5181 Encounter for therapeutic drug level monitoring: Secondary | ICD-10-CM | POA: Insufficient documentation

## 2012-05-06 DIAGNOSIS — M5030 Other cervical disc degeneration, unspecified cervical region: Secondary | ICD-10-CM | POA: Insufficient documentation

## 2012-05-06 DIAGNOSIS — Z79899 Other long term (current) drug therapy: Secondary | ICD-10-CM | POA: Insufficient documentation

## 2012-05-06 DIAGNOSIS — M546 Pain in thoracic spine: Secondary | ICD-10-CM | POA: Insufficient documentation

## 2012-05-14 ENCOUNTER — Encounter (HOSPITAL_COMMUNITY): Payer: Self-pay | Admitting: Emergency Medicine

## 2012-05-14 ENCOUNTER — Emergency Department (HOSPITAL_COMMUNITY)
Admission: EM | Admit: 2012-05-14 | Discharge: 2012-05-14 | Disposition: A | Discharge status: Home / Self Care | Payer: Medicare Other | Attending: Emergency Medicine | Admitting: Emergency Medicine

## 2012-05-14 DIAGNOSIS — Z8679 Personal history of other diseases of the circulatory system: Secondary | ICD-10-CM | POA: Insufficient documentation

## 2012-05-14 DIAGNOSIS — G4733 Obstructive sleep apnea (adult) (pediatric): Secondary | ICD-10-CM | POA: Insufficient documentation

## 2012-05-14 DIAGNOSIS — E669 Obesity, unspecified: Secondary | ICD-10-CM | POA: Insufficient documentation

## 2012-05-14 DIAGNOSIS — M545 Low back pain, unspecified: Secondary | ICD-10-CM | POA: Insufficient documentation

## 2012-05-14 DIAGNOSIS — Z8719 Personal history of other diseases of the digestive system: Secondary | ICD-10-CM | POA: Insufficient documentation

## 2012-05-14 DIAGNOSIS — Z8709 Personal history of other diseases of the respiratory system: Secondary | ICD-10-CM | POA: Insufficient documentation

## 2012-05-14 DIAGNOSIS — Z860100 Personal history of colon polyps, unspecified: Secondary | ICD-10-CM | POA: Insufficient documentation

## 2012-05-14 DIAGNOSIS — I10 Essential (primary) hypertension: Secondary | ICD-10-CM | POA: Insufficient documentation

## 2012-05-14 DIAGNOSIS — Z87448 Personal history of other diseases of urinary system: Secondary | ICD-10-CM | POA: Insufficient documentation

## 2012-05-14 DIAGNOSIS — G8929 Other chronic pain: Secondary | ICD-10-CM | POA: Insufficient documentation

## 2012-05-14 DIAGNOSIS — G2581 Restless legs syndrome: Secondary | ICD-10-CM | POA: Insufficient documentation

## 2012-05-14 DIAGNOSIS — F17200 Nicotine dependence, unspecified, uncomplicated: Secondary | ICD-10-CM | POA: Insufficient documentation

## 2012-05-14 DIAGNOSIS — Z79899 Other long term (current) drug therapy: Secondary | ICD-10-CM | POA: Insufficient documentation

## 2012-05-14 DIAGNOSIS — Z8669 Personal history of other diseases of the nervous system and sense organs: Secondary | ICD-10-CM | POA: Insufficient documentation

## 2012-05-14 DIAGNOSIS — E119 Type 2 diabetes mellitus without complications: Secondary | ICD-10-CM | POA: Insufficient documentation

## 2012-05-14 DIAGNOSIS — Z8659 Personal history of other mental and behavioral disorders: Secondary | ICD-10-CM | POA: Insufficient documentation

## 2012-05-14 DIAGNOSIS — Z872 Personal history of diseases of the skin and subcutaneous tissue: Secondary | ICD-10-CM | POA: Insufficient documentation

## 2012-05-14 DIAGNOSIS — Z862 Personal history of diseases of the blood and blood-forming organs and certain disorders involving the immune mechanism: Secondary | ICD-10-CM | POA: Insufficient documentation

## 2012-05-14 DIAGNOSIS — Z8711 Personal history of peptic ulcer disease: Secondary | ICD-10-CM | POA: Insufficient documentation

## 2012-05-14 DIAGNOSIS — Z8739 Personal history of other diseases of the musculoskeletal system and connective tissue: Secondary | ICD-10-CM | POA: Insufficient documentation

## 2012-05-14 DIAGNOSIS — J45909 Unspecified asthma, uncomplicated: Secondary | ICD-10-CM | POA: Insufficient documentation

## 2012-05-14 DIAGNOSIS — Z87440 Personal history of urinary (tract) infections: Secondary | ICD-10-CM | POA: Insufficient documentation

## 2012-05-14 DIAGNOSIS — E785 Hyperlipidemia, unspecified: Secondary | ICD-10-CM | POA: Insufficient documentation

## 2012-05-14 MED ORDER — KETOROLAC TROMETHAMINE 60 MG/2ML IM SOLN
60.0000 mg | Freq: Once | INTRAMUSCULAR | Status: AC
  Administered 2012-05-14: 60 mg via INTRAMUSCULAR
  Filled 2012-05-14: qty 2

## 2012-05-14 NOTE — ED Notes (Signed)
Courtney called from [facility] to notify us of pt arrival. Pain clinic is fearful of pt having withdrawal symptoms from changing their meds around. She stated if there were questions you could reach her at [phone] and press option 3 for nurse.  

## 2012-05-14 NOTE — ED Notes (Signed)
Pt sees pain dr at [facility]. States took her off all meds except neurontin. States he sent her here to have a pain assessment to continue pain meds. Pt c/o fibromyalgia and hurts all over. Pt slightly groggy, slow moving. Alert/oriented. Using walker

## 2012-05-14 NOTE — ED Notes (Signed)
Checked bs this am and was 121

## 2012-05-14 NOTE — ED Provider Notes (Signed)
History     CSN: [id number]  Arrival date & time 05/14/12  1015   First MD Initiated Contact with Patient 05/14/12 1042      Chief Complaint  Patient presents with  . Fibromyalgia    "pain assessment"     The history is provided by the patient [facility] health, care everywhere).   the patient reports she is currently off his chronic pain medicines and reports increasing pain.  He spoke with his chronic pain specialist in [facility] Dr. Myna Hidalgo, who per the notes was under the understanding that the patient was in narcotic withdrawal and requested that he go to the nearest emergency department for treatment of his narcotic withdrawal.  The patient reports she was sent to the emergency department by his chronic pain specialist for a "pain assessment". Her chronic pain is in her low back.  The only medication she is currently on as her Neurontin.  The patient was ambulatory in the emergency department.   Past Medical History  Diagnosis Date  . 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 Degeneration of lumbar or lumbosacral intervertebral disc   . Dermatophytosis of the body   . Chronic pain syndrome   . Edema   . Insomnia, unspecified   . Unspecified hypertrophic and atrophic condition of skin   . Cerebral aneurysm, nonruptured   . Unspecified hereditary and idiopathic peripheral neuropathy   . Tobacco use disorder   . Unspecified urinary incontinence   . Migraine, unspecified, without mention of intractable migraine without mention of status migrainosus   . Urinary tract infection, site not specified   . Carpal tunnel syndrome   . Unspecified sleep apnea     NPSG 01-09-09 AHI 6.2, RDI 13  . Restless legs syndrome (RLS)   . Myalgia and myositis, unspecified   . Arthropathy, unspecified, site unspecified   . Peptic ulcer, unspecified site, unspecified as acute or chronic, without mention of hemorrhage, perforation, or obstruction   . Bronchitis,  not specified as acute or chronic   . Osteoarthrosis, unspecified whether generalized or localized, unspecified site   . Unspecified essential hypertension   . Other and unspecified hyperlipidemia   . Esophageal reflux   . Type II or unspecified type diabetes mellitus without mention of complication, not stated as uncontrolled   . Depressive disorder, not elsewhere classified   . Unspecified asthma   . Anxiety state, unspecified   . Allergic rhinitis, cause unspecified     Skin Test POS 06-26-09  . Obesity   . Cirrhosis   . Colon polyp   . Anemia     Past Surgical History  Procedure Date  . Bladder lift 1993  . Cerebreal anuerysm 2008  . Tonsillectomy age 61  . Laproscopic surgery abdomen for intestine infection 1984  . Carpal tunnel release 1996 2009    x 2 bilateral  . Partial hysterectomy age 89    unlcear why this was done  . Colonoscopy   . Total knee arthroplasty     right  . Clavicle surgery     fracture    Family History  Problem Relation Age of Onset  . Alzheimer's disease Father     CVA  . Uterine cancer Mother   . Lung cancer Mother   . COPD Mother   . Heart disease Mother   . Heart disease Maternal Aunt     x 3 aunt  . Ovarian cancer Maternal Aunt   . Breast cancer  Maternal Aunt   . Colon polyps Mother   . Diabetes Mother   . Diabetes Father   . Irritable bowel syndrome Mother     maternal aunts x 2    History  Substance Use Topics  . Smoking status: Current Every Day Smoker -- 0.5 packs/day    Types: Cigarettes  . Smokeless tobacco: Never Used     Comment: Using patches  . Alcohol Use: No     Comment: rarely- glass on wine    OB History    Grav Para Term Preterm Abortions TAB SAB Ect Mult Living                  Review of Systems  All other systems reviewed and are negative.    Allergies  Bupropion hcl; Erythromycin; Haloperidol lactate; Latex; Neomycin-bacitracin zn-polymyx; and Tape  Home Medications   Current Outpatient Rx   Name  Route  Sig  Dispense  Refill  . ALBUTEROL SULFATE HFA 108 (90 BASE) MCG/ACT IN AERS   Inhalation   Inhale 2 puffs into the lungs every 4 (four) hours as needed. For shortness of breath         . BENZTROPINE MESYLATE 1 MG PO TABS   Oral   Take 1 mg by mouth 2 (two) times daily.         Marland Kitchen CALCIUM 600+D PO   Oral   Take 3 tablets by mouth daily.         Marland Kitchen DIHYDROERGOTAMINE MESYLATE 4 MG/ML NA SOLN   Nasal   Place 1 spray into the nose as needed. Use in one nostril as directed.  No more than 4 sprays in one hour for migraines.         . DULOXETINE HCL 30 MG PO CPEP   Oral   Take 30 mg by mouth 3 (three) times daily.         Marland Kitchen FERROUS SULFATE 325 (65 FE) MG PO TABS   Oral   Take 325 mg by mouth daily with breakfast.         . GABAPENTIN 400 MG PO CAPS   Oral   Take 400 mg by mouth 3 (three) times daily.          Marland Kitchen HYDROXYZINE PAMOATE 50 MG PO CAPS   Oral   Take 100 mg by mouth daily.          Marland Kitchen MECLIZINE HCL 25 MG PO TABS   Oral   Take 25 mg by mouth 3 (three) times daily as needed. For dizziness.         Marland Kitchen MELATONIN 5 MG PO TABS   Oral   Take 1 tablet by mouth at bedtime as needed. For sleep.         . METHOCARBAMOL 500 MG PO TABS   Oral   Take 500 mg by mouth 3 (three) times daily.          . ADULT MULTIVITAMIN W/MINERALS CH   Oral   Take 1 tablet by mouth daily.         Marland Kitchen NITROGLYCERIN 0.4 MG SL SUBL   Sublingual   Place 1 tablet (0.4 mg total) under the tongue every 5 (five) minutes as needed for chest pain.   30 tablet   0   . ONDANSETRON HCL 4 MG PO TABS   Oral   Take 4 mg by mouth every 8 (eight) hours as needed.          Marland Kitchen  OXYCODONE HCL 30 MG PO TABS   Oral   Take 30 mg by mouth every 6 (six) hours as needed. For pain.         Marland Kitchen POTASSIUM CHLORIDE CRYS ER 20 MEQ PO TBCR   Oral   Take 1 tablet (20 mEq total) by mouth 2 (two) times daily.   14 tablet   0   . PRESCRIPTION MEDICATION   Subcutaneous   Inject 1  Syringe into the skin daily as needed. For migraines.  DHE 45         . ROPINIROLE HCL 5 MG PO TABS   Oral   Take 5 mg by mouth at bedtime as needed. For RLS.         . TOPIRAMATE 200 MG PO TABS   Oral   Take 200 mg by mouth daily.          . TRAZODONE HCL 150 MG PO TABS   Oral   Take 150 mg by mouth at bedtime.           BP 127/95  Pulse 95  Temp 98 F (36.7 C) (Oral)  Resp 17  Ht 5\' 4"  (1.626 m)  Wt 185 lb (83.915 kg)  BMI 31.76 kg/m2  SpO2 99%  Physical Exam  Constitutional: She is oriented to person, place, and time. She appears well-developed and well-nourished.  HENT:  Head: Normocephalic.  Eyes: EOM are normal.  Neck: Normal range of motion.  Pulmonary/Chest: Effort normal.  Abdominal: She exhibits no distension.  Musculoskeletal: Normal range of motion.  Neurological: She is alert and oriented to person, place, and time.  Psychiatric: She has a normal mood and affect.    ED Course  Procedures (including critical care time)  Labs Reviewed - No data to display No results found.   1. Chronic pain       MDM  This is a chronic pain exacerbation.  She has a chronic pain specialist.  She is currently off all narcotics.  She's requesting a shot of ketorolac.  There is no evidence of life-threatening emergency.  Discharge home with instructions to followup with her pain specialist        Lyanne Co, MD 05/14/12 1105

## 2012-07-05 IMAGING — CR DG CHEST 2V
2 series · 2 of 2 positions shown · non-contrast
Comparison: 05/10/2010 and earlier.

CLINICAL DATA: 54-year-old female preoperative study, spinal cord
stimulator.

CHEST - 2 VIEW

[view not recorded (1 of 2)]
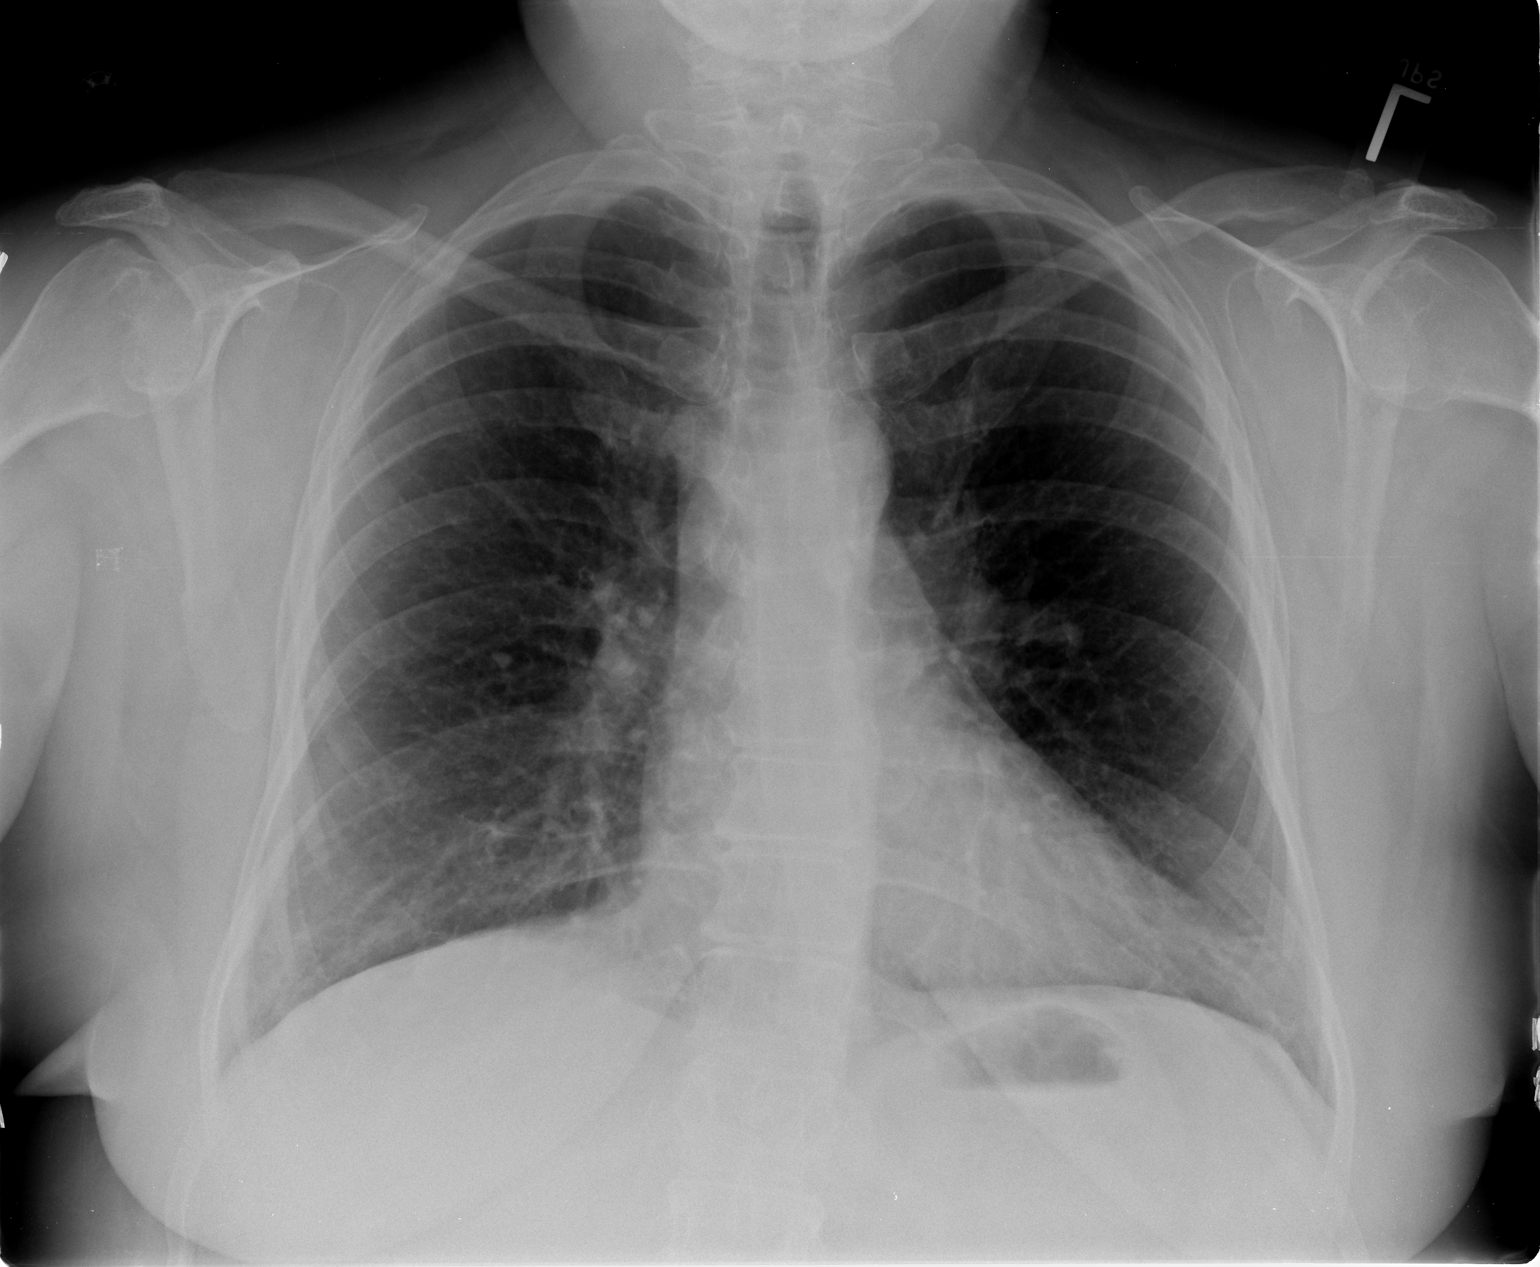

[view not recorded (2 of 2)]
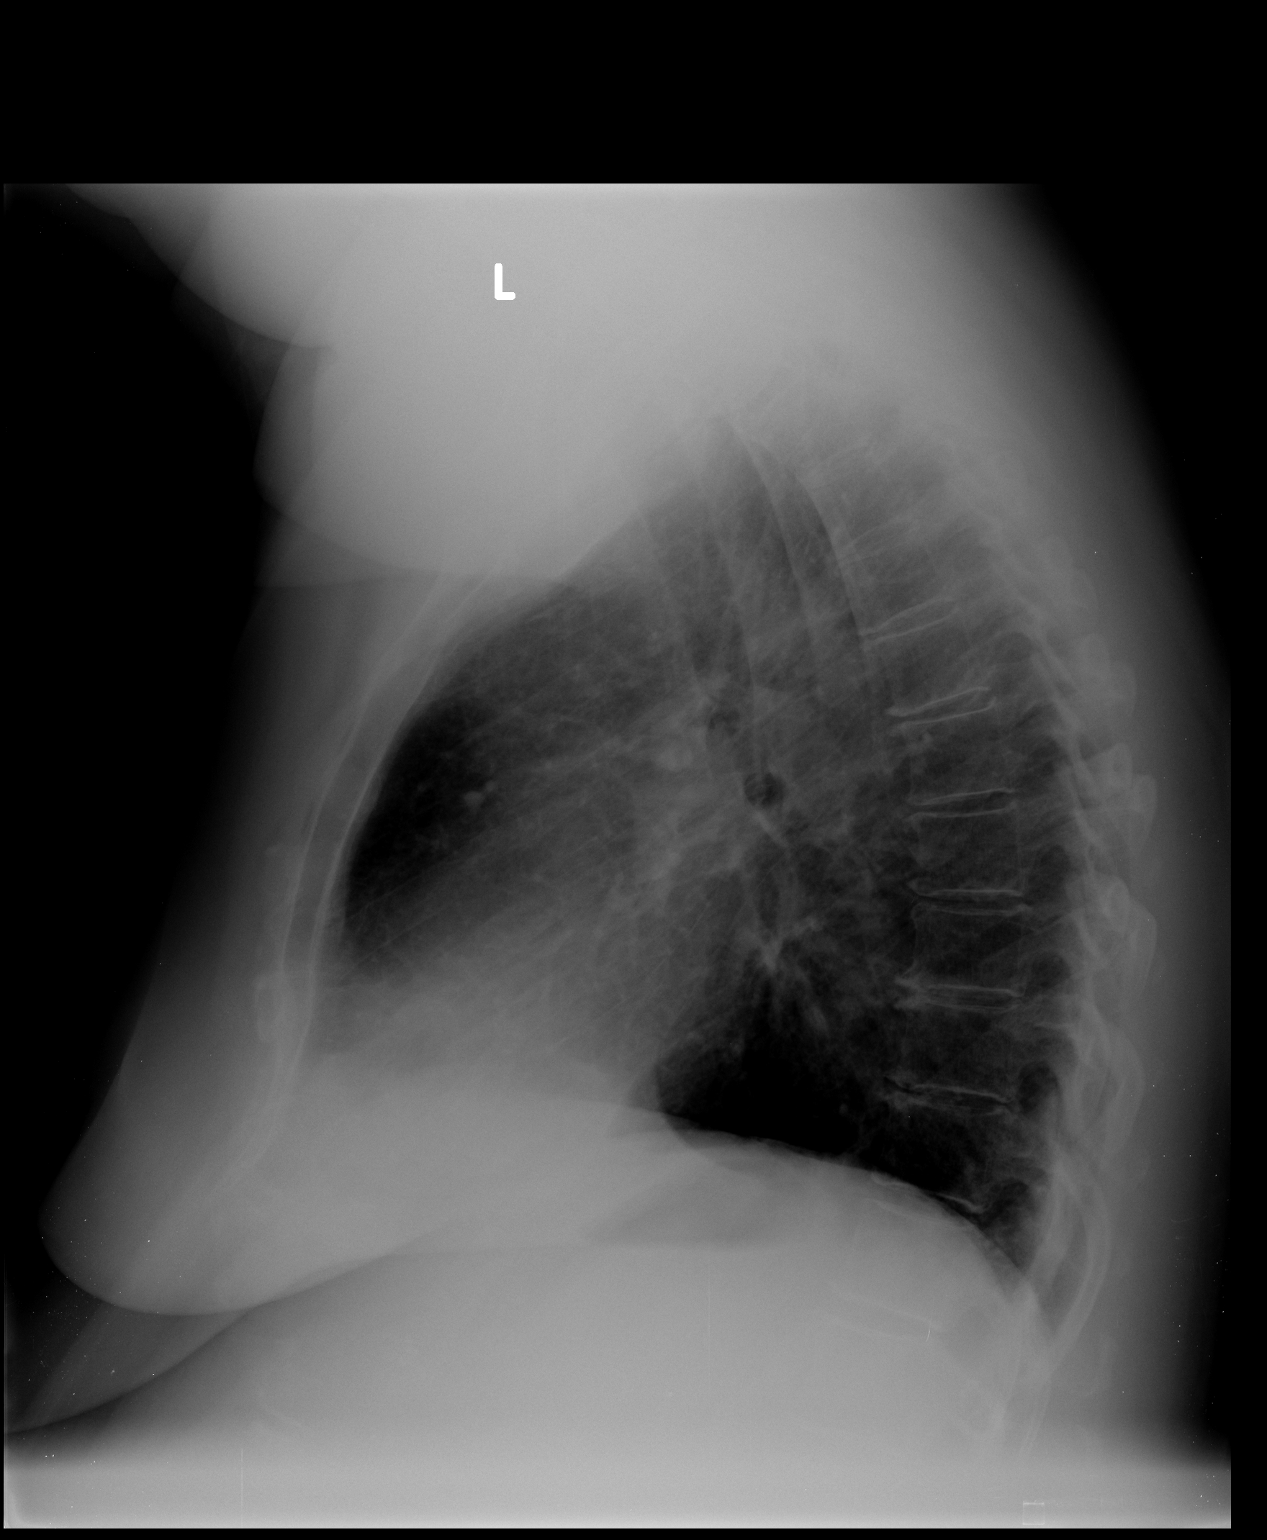

[2 of 2 positions shown; findings below may reference images not displayed]

FINDINGS: Stable lung volumes.  No pneumothorax, pulmonary edema,
pleural effusion or acute pulmonary opacity.  Chronic calcified
granulomas and mild diffuse increased interstitial markings.
Cardiac size and mediastinal contours are within normal limits.
Visualized tracheal air column is within normal limits.  The No
acute osseous abnormality identified.
IMPRESSION: No acute cardiopulmonary abnormality.

## 2012-07-09 IMAGING — RF DG THORACIC SPINE 1V
1 series · 1 of 1 positions shown · non-contrast
Comparison: Chest radiographs 12/07/2010.

CLINICAL DATA: T7-T9 spinal cord stimulator placement.

THORACIC SPINE - 1 VIEW

[Series 1: run · 1 of 1 slices shown]
[im 1/1]
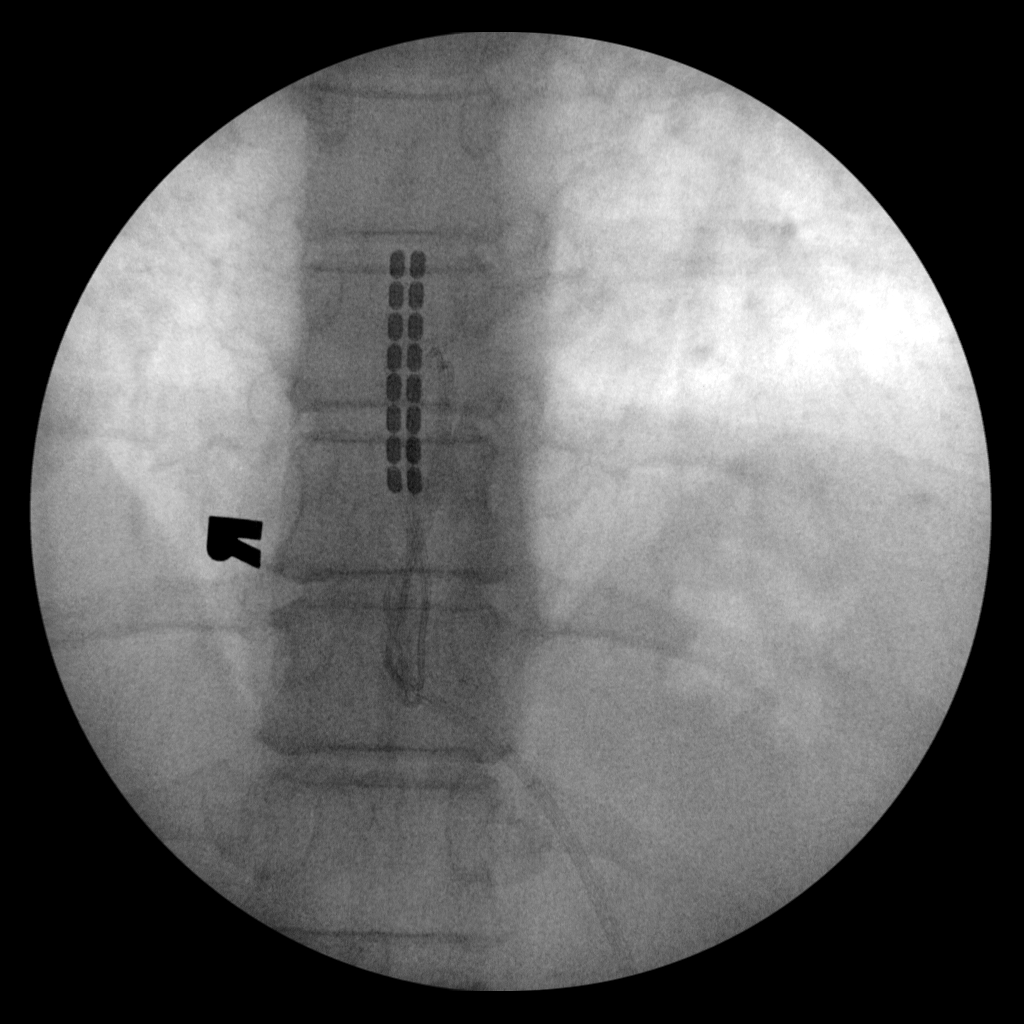

[1 of 1 positions shown; findings below may reference images not displayed]

FINDINGS: Single AP fluoroscopic view of the lower thoracic spine
demonstrates a thoracic spinal stimulator in place.  Due to the
small field of view, exact localization of the stimulator is not
possible.
IMPRESSION: Lower thoracic spinal stimulator placement.

## 2012-08-09 ENCOUNTER — Emergency Department (HOSPITAL_COMMUNITY)
Admission: EM | Admit: 2012-08-09 | Discharge: 2012-08-09 | Disposition: A | Discharge status: Home / Self Care | Payer: Medicare Other | Attending: Emergency Medicine | Admitting: Emergency Medicine

## 2012-08-09 ENCOUNTER — Emergency Department (HOSPITAL_COMMUNITY): Payer: Medicare Other

## 2012-08-09 ENCOUNTER — Encounter (HOSPITAL_COMMUNITY): Payer: Self-pay | Admitting: Emergency Medicine

## 2012-08-09 DIAGNOSIS — Z8711 Personal history of peptic ulcer disease: Secondary | ICD-10-CM | POA: Insufficient documentation

## 2012-08-09 DIAGNOSIS — Z79899 Other long term (current) drug therapy: Secondary | ICD-10-CM | POA: Insufficient documentation

## 2012-08-09 DIAGNOSIS — Z872 Personal history of diseases of the skin and subcutaneous tissue: Secondary | ICD-10-CM | POA: Insufficient documentation

## 2012-08-09 DIAGNOSIS — Z8739 Personal history of other diseases of the musculoskeletal system and connective tissue: Secondary | ICD-10-CM | POA: Insufficient documentation

## 2012-08-09 DIAGNOSIS — I10 Essential (primary) hypertension: Secondary | ICD-10-CM | POA: Insufficient documentation

## 2012-08-09 DIAGNOSIS — E669 Obesity, unspecified: Secondary | ICD-10-CM | POA: Insufficient documentation

## 2012-08-09 DIAGNOSIS — Z8669 Personal history of other diseases of the nervous system and sense organs: Secondary | ICD-10-CM | POA: Insufficient documentation

## 2012-08-09 DIAGNOSIS — J45909 Unspecified asthma, uncomplicated: Secondary | ICD-10-CM | POA: Insufficient documentation

## 2012-08-09 DIAGNOSIS — E119 Type 2 diabetes mellitus without complications: Secondary | ICD-10-CM | POA: Insufficient documentation

## 2012-08-09 DIAGNOSIS — E785 Hyperlipidemia, unspecified: Secondary | ICD-10-CM | POA: Insufficient documentation

## 2012-08-09 DIAGNOSIS — G43909 Migraine, unspecified, not intractable, without status migrainosus: Secondary | ICD-10-CM | POA: Insufficient documentation

## 2012-08-09 DIAGNOSIS — Z8719 Personal history of other diseases of the digestive system: Secondary | ICD-10-CM | POA: Insufficient documentation

## 2012-08-09 DIAGNOSIS — Z860100 Personal history of colon polyps, unspecified: Secondary | ICD-10-CM | POA: Insufficient documentation

## 2012-08-09 DIAGNOSIS — M545 Low back pain, unspecified: Secondary | ICD-10-CM | POA: Insufficient documentation

## 2012-08-09 DIAGNOSIS — G2581 Restless legs syndrome: Secondary | ICD-10-CM | POA: Insufficient documentation

## 2012-08-09 DIAGNOSIS — Z87448 Personal history of other diseases of urinary system: Secondary | ICD-10-CM | POA: Insufficient documentation

## 2012-08-09 DIAGNOSIS — M1990 Unspecified osteoarthritis, unspecified site: Secondary | ICD-10-CM | POA: Insufficient documentation

## 2012-08-09 DIAGNOSIS — F3289 Other specified depressive episodes: Secondary | ICD-10-CM | POA: Insufficient documentation

## 2012-08-09 DIAGNOSIS — G609 Hereditary and idiopathic neuropathy, unspecified: Secondary | ICD-10-CM | POA: Insufficient documentation

## 2012-08-09 DIAGNOSIS — F17200 Nicotine dependence, unspecified, uncomplicated: Secondary | ICD-10-CM | POA: Insufficient documentation

## 2012-08-09 DIAGNOSIS — G894 Chronic pain syndrome: Secondary | ICD-10-CM | POA: Insufficient documentation

## 2012-08-09 DIAGNOSIS — Z8619 Personal history of other infectious and parasitic diseases: Secondary | ICD-10-CM | POA: Insufficient documentation

## 2012-08-09 DIAGNOSIS — E069 Thyroiditis, unspecified: Secondary | ICD-10-CM | POA: Insufficient documentation

## 2012-08-09 DIAGNOSIS — K219 Gastro-esophageal reflux disease without esophagitis: Secondary | ICD-10-CM | POA: Insufficient documentation

## 2012-08-09 DIAGNOSIS — Z87440 Personal history of urinary (tract) infections: Secondary | ICD-10-CM | POA: Insufficient documentation

## 2012-08-09 DIAGNOSIS — IMO0001 Reserved for inherently not codable concepts without codable children: Secondary | ICD-10-CM | POA: Insufficient documentation

## 2012-08-09 DIAGNOSIS — G4700 Insomnia, unspecified: Secondary | ICD-10-CM | POA: Insufficient documentation

## 2012-08-09 DIAGNOSIS — F411 Generalized anxiety disorder: Secondary | ICD-10-CM | POA: Insufficient documentation

## 2012-08-09 MED ORDER — PREDNISONE 10 MG PO TABS: 20.0000 mg | ORAL_TABLET | Freq: Every day | ORAL | Status: DC

## 2012-08-09 MED ORDER — OXYCODONE-ACETAMINOPHEN 5-325 MG PO TABS: 1.0000 | ORAL_TABLET | Freq: Four times a day (QID) | ORAL | Status: DC | PRN

## 2012-08-09 MED ORDER — KETOROLAC TROMETHAMINE 60 MG/2ML IM SOLN
60.0000 mg | Freq: Once | INTRAMUSCULAR | Status: AC
  Administered 2012-08-09: 60 mg via INTRAMUSCULAR
  Filled 2012-08-09: qty 2

## 2012-08-09 MED ORDER — METHYLPREDNISOLONE SODIUM SUCC 125 MG IJ SOLR
125.0000 mg | Freq: Once | INTRAMUSCULAR | Status: AC
  Administered 2012-08-09: 125 mg via INTRAMUSCULAR
  Filled 2012-08-09: qty 2

## 2012-08-09 NOTE — ED Notes (Addendum)
Pt has hx of chronic back pain. Pain is in lower back and got worse yesterday. No problems urinating. Hx of inadequate urinary sensation. Patient has internal back stimulator that she recently turned on.

## 2012-08-09 NOTE — ED Provider Notes (Signed)
History    This chart was scribed for [name], MD by Charolett Bumpers, ED Scribe. The patient was seen in room APA08/APA08. Patient's care was started at 1156.   CSN: [id number]  Arrival date & time 08/09/12  1110   First MD Initiated Contact with Patient 08/09/12 1156      Chief Complaint  Patient presents with  . Back Pain   [name] is a 57 y.o. female who presents to the Emergency Department complaining of severe, lower back pain that worsened yesterday. She reports a h/o chronic back pain for years from ruptured and bulging discs. She states that today's pain is the most severe it has ever been and the pain radiates down both legs. She normally takes Tramadol for her pain. She denies any difficulty urinating. She has an internal back stimulator that she recently turned on. She states that she hasn't had sensation in her legs since her husband fell on her, she was seen at [facility] at that time.    Patient is a 57 y.o. female presenting with back pain. The history is provided by the patient. No language interpreter was used.  Back Pain Location:  Lumbar spine Pain severity:  Severe Timing:  Constant Progression:  Worsening Chronicity:  Chronic Associated symptoms: no abdominal pain, no chest pain and no headaches     Past Medical History  Diagnosis Date  . 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 Degeneration of lumbar or lumbosacral intervertebral disc   . Dermatophytosis of the body   . Chronic pain syndrome   . Edema   . Insomnia, unspecified   . Unspecified hypertrophic and atrophic condition of skin   . Cerebral aneurysm, nonruptured   . Unspecified hereditary and idiopathic peripheral neuropathy   . Tobacco use disorder   . Unspecified urinary incontinence   . Migraine, unspecified, without mention of intractable migraine without mention of status migrainosus   . Urinary tract infection, site not specified   . Carpal tunnel  syndrome   . Unspecified sleep apnea     NPSG 01-09-09 AHI 6.2, RDI 13  . Restless legs syndrome (RLS)   . Myalgia and myositis, unspecified   . Arthropathy, unspecified, site unspecified   . Peptic ulcer, unspecified site, unspecified as acute or chronic, without mention of hemorrhage, perforation, or obstruction   . Bronchitis, not specified as acute or chronic   . Osteoarthrosis, unspecified whether generalized or localized, unspecified site   . Unspecified essential hypertension   . Other and unspecified hyperlipidemia   . Esophageal reflux   . Type II or unspecified type diabetes mellitus without mention of complication, not stated as uncontrolled   . Depressive disorder, not elsewhere classified   . Unspecified asthma   . Anxiety state, unspecified   . Allergic rhinitis, cause unspecified     Skin Test POS 06-26-09  . Obesity   . Cirrhosis   . Colon polyp   . Anemia     Past Surgical History  Procedure Laterality Date  . Bladder lift  1993  . Cerebreal anuerysm  2008  . Tonsillectomy  age 66  . Laproscopic surgery abdomen for intestine infection  1984  . Carpal tunnel release  1996 2009    x 2 bilateral  . Partial hysterectomy  age 26    unlcear why this was done  . Colonoscopy    . Total knee arthroplasty      right  . Clavicle surgery  fracture    Family History  Problem Relation Age of Onset  . Alzheimer's disease Father     CVA  . Uterine cancer Mother   . Lung cancer Mother   . COPD Mother   . Heart disease Mother   . Heart disease Maternal Aunt     x 3 aunt  . Ovarian cancer Maternal Aunt   . Breast cancer Maternal Aunt   . Colon polyps Mother   . Diabetes Mother   . Diabetes Father   . Irritable bowel syndrome Mother     maternal aunts x 2    History  Substance Use Topics  . Smoking status: Current Every Day Smoker -- 0.50 packs/day    Types: Cigarettes  . Smokeless tobacco: Never Used     Comment: Using patches  . Alcohol Use: No      Comment: rarely- glass on wine    OB History   Grav Para Term Preterm Abortions TAB SAB Ect Mult Living                  Review of Systems  Constitutional: Negative for fatigue.  HENT: Negative for congestion, sinus pressure and ear discharge.   Eyes: Negative for discharge.  Respiratory: Negative for cough.   Cardiovascular: Negative for chest pain.  Gastrointestinal: Negative for abdominal pain and diarrhea.  Genitourinary: Negative for frequency, hematuria and difficulty urinating.  Musculoskeletal: Positive for back pain.  Skin: Negative for rash.  Neurological: Negative for seizures and headaches.  Psychiatric/Behavioral: Negative for hallucinations.  All other systems reviewed and are negative.    Allergies  Bupropion hcl; Erythromycin; Haloperidol lactate; Latex; Neomycin-bacitracin zn-polymyx; and Tape  Home Medications   Current Outpatient Rx  Name  Route  Sig  Dispense  Refill  . albuterol (PROAIR HFA) 108 (90 BASE) MCG/ACT inhaler   Inhalation   Inhale 2 puffs into the lungs every 4 (four) hours as needed. For shortness of breath         . benztropine (COGENTIN) 1 MG tablet   Oral   Take 1 mg by mouth 2 (two) times daily.         . Calcium Carbonate-Vitamin D (CALCIUM 600+D PO)   Oral   Take 3 tablets by mouth daily.         Marland Kitchen dihydroergotamine (MIGRANAL) 4 MG/ML nasal spray   Nasal   Place 1 spray into the nose as needed. Use in one nostril as directed.  No more than 4 sprays in one hour for migraines.         . DULoxetine (CYMBALTA) 30 MG capsule   Oral   Take 30 mg by mouth 3 (three) times daily.         . ferrous sulfate 325 (65 FE) MG tablet   Oral   Take 325 mg by mouth daily with breakfast.         . gabapentin (NEURONTIN) 400 MG capsule   Oral   Take 400 mg by mouth 3 (three) times daily.          . hydrOXYzine (VISTARIL) 50 MG capsule   Oral   Take 100 mg by mouth daily.          . meclizine (ANTIVERT) 25 MG tablet    Oral   Take 25 mg by mouth 3 (three) times daily as needed. For dizziness.         . Melatonin 5 MG TABS   Oral   Take 1  tablet by mouth at bedtime as needed. For sleep.         . methocarbamol (ROBAXIN) 500 MG tablet   Oral   Take 500 mg by mouth 3 (three) times daily.          . Multiple Vitamin (MULTIVITAMIN WITH MINERALS) TABS   Oral   Take 1 tablet by mouth daily.         . nitroGLYCERIN (NITROSTAT) 0.4 MG SL tablet   Sublingual   Place 1 tablet (0.4 mg total) under the tongue every 5 (five) minutes as needed for chest pain.   30 tablet   0   . ondansetron (ZOFRAN) 4 MG tablet   Oral   Take 4 mg by mouth every 8 (eight) hours as needed.          Marland Kitchen oxycodone (ROXICODONE) 30 MG immediate release tablet   Oral   Take 30 mg by mouth every 6 (six) hours as needed. For pain.         . potassium chloride SA (K-DUR,KLOR-CON) 20 MEQ tablet   Oral   Take 1 tablet (20 mEq total) by mouth 2 (two) times daily.   14 tablet   0   . PRESCRIPTION MEDICATION   Subcutaneous   Inject 1 Syringe into the skin daily as needed. For migraines.  DHE 45         . ropinirole (REQUIP) 5 MG tablet   Oral   Take 5 mg by mouth at bedtime as needed. For RLS.         Marland Kitchen topiramate (TOPAMAX) 200 MG tablet   Oral   Take 200 mg by mouth daily.          . traZODone (DESYREL) 150 MG tablet   Oral   Take 150 mg by mouth at bedtime.           BP 113/79  Pulse 84  Temp(Src) 97.9 F (36.6 C) (Oral)  Resp 20  Ht 5\' 4"  (1.626 m)  Wt 185 lb (83.915 kg)  BMI 31.74 kg/m2  SpO2 100%  Physical Exam  Nursing note and vitals reviewed. Constitutional: She is oriented to person, place, and time. She appears well-developed. She appears lethargic.  HENT:  Head: Normocephalic and atraumatic.  Eyes: Conjunctivae and EOM are normal. No scleral icterus.  Neck: Neck supple. No thyromegaly present.  Cardiovascular: Normal rate, regular rhythm and normal heart sounds.  Exam reveals no  gallop and no friction rub.   No murmur heard. Pulmonary/Chest: Effort normal and breath sounds normal. No stridor. She has no wheezes. She has no rales. She exhibits no tenderness.  Abdominal: Soft. Bowel sounds are normal. She exhibits no distension. There is no tenderness. There is no rebound.  Musculoskeletal: Normal range of motion. She exhibits tenderness. She exhibits no edema.  Mild tenderness to lumbar spine.   Lymphadenopathy:    She has no cervical adenopathy.  Neurological: She is oriented to person, place, and time. She appears lethargic. Coordination normal.  Moderately lethargic.   Skin: No rash noted. No erythema.  Psychiatric: She has a normal mood and affect. Her behavior is normal.    ED Course  Procedures (including critical care time)  DIAGNOSTIC STUDIES: Oxygen Saturation is 100% on room air, normal by my interpretation.    COORDINATION OF CARE:  12:00-Discussed planned course of treatment with the patient including x-rays of her l-spine and pain management, who is agreeable at this time.   12:15-Medication Orders: Ketorolac (Toradol)  injection 60 mg-once; Methylprednisolone sodium succinate (Solu-medrol) 125 mg/2 mL injection 125 mg-once.   Labs Reviewed - No data to display Dg Lumbar Spine Complete  08/09/2012  *RADIOLOGY REPORT*  Clinical Data: Low back pain  LUMBAR SPINE - COMPLETE 4+ VIEW  Comparison: Abdominal radiographs dated 01/17/2012  Findings: Five lumbar-type vertebral bodies.  Normal lumbar lordosis.  No evidence of fracture or dislocation.  Vertebral body heights are maintained.  Visualized bony pelvis appears intact.  Mild multilevel degenerative changes.  Thoracic spine stimulator.  Vascular calcifications.  Moderate colonic stool burden.  IMPRESSION: No fracture or dislocation is seen.  Mild multilevel degenerative changes.  Moderate colonic stool burden, suggesting constipation.   Original Report Authenticated By: Charline Bills, M.D.      No  diagnosis found.    MDM     The chart was scribed for me under my direct supervision.  I personally performed the history, physical, and medical decision making and all procedures in the evaluation of this patient.[name], MD 08/09/12 [phone]

## 2012-08-09 NOTE — ED Notes (Signed)
Patient would like to know how long before discharge.

## 2012-10-21 ENCOUNTER — Other Ambulatory Visit (INDEPENDENT_AMBULATORY_CARE_PROVIDER_SITE_OTHER): Payer: Medicare Other

## 2012-10-21 ENCOUNTER — Encounter: Payer: Self-pay | Admitting: Internal Medicine

## 2012-10-21 ENCOUNTER — Ambulatory Visit (INDEPENDENT_AMBULATORY_CARE_PROVIDER_SITE_OTHER): Payer: Medicare Other | Admitting: Internal Medicine

## 2012-10-21 VITALS — BP 140/78 | HR 92 | Ht 64.0 in | Wt 201.0 lb

## 2012-10-21 DIAGNOSIS — K7460 Unspecified cirrhosis of liver: Secondary | ICD-10-CM

## 2012-10-21 DIAGNOSIS — K766 Portal hypertension: Secondary | ICD-10-CM

## 2012-10-21 DIAGNOSIS — K7689 Other specified diseases of liver: Secondary | ICD-10-CM

## 2012-10-21 DIAGNOSIS — K219 Gastro-esophageal reflux disease without esophagitis: Secondary | ICD-10-CM

## 2012-10-21 DIAGNOSIS — D126 Benign neoplasm of colon, unspecified: Secondary | ICD-10-CM

## 2012-10-21 DIAGNOSIS — K7581 Nonalcoholic steatohepatitis (NASH): Secondary | ICD-10-CM

## 2012-10-21 LAB — CBC WITH DIFFERENTIAL/PLATELET
Basophils Absolute: 0.1 10*3/uL (ref 0.0–0.1)
Eosinophils Relative: 4.8 % (ref 0.0–5.0)
HCT: 41.9 % (ref 36.0–46.0)
Hemoglobin: 14.3 g/dL (ref 12.0–15.0)
Lymphocytes Relative: 38.6 % (ref 12.0–46.0)
Lymphs Abs: 2.5 10*3/uL (ref 0.7–4.0)
Monocytes Relative: 8 % (ref 3.0–12.0)
Neutro Abs: 3.1 10*3/uL (ref 1.4–7.7)
RBC: 4.72 Mil/uL (ref 3.87–5.11)
WBC: 6.5 10*3/uL (ref 4.5–10.5)

## 2012-10-21 LAB — BASIC METABOLIC PANEL
BUN: 22 mg/dL (ref 6–23)
CO2: 23 mEq/L (ref 19–32)
Calcium: 9.7 mg/dL (ref 8.4–10.5)
Creatinine, Ser: 0.9 mg/dL (ref 0.4–1.2)
GFR: 66.77 mL/min (ref >60.00)
Glucose, Bld: 93 mg/dL (ref 70–99)
Sodium: 141 mEq/L (ref 135–145)

## 2012-10-21 LAB — HEPATIC FUNCTION PANEL
Albumin: 4.1 g/dL (ref 3.5–5.2)
Total Protein: 7.4 g/dL (ref 6.0–8.3)

## 2012-10-21 MED ORDER — ESOMEPRAZOLE MAGNESIUM 40 MG PO CPDR: 40.0000 mg | DELAYED_RELEASE_CAPSULE | Freq: Every day | ORAL | Status: DC

## 2012-10-21 MED ORDER — OMEPRAZOLE 40 MG PO CPDR: 40.0000 mg | DELAYED_RELEASE_CAPSULE | Freq: Every day | ORAL | Status: AC

## 2012-10-21 NOTE — Patient Instructions (Addendum)
You have been scheduled for an abdominal ultrasound at [facility] (1st floor of hospital) on 10/22/12 at 8:00am. Please arrive 15 minutes prior to your appointment for registration. Make certain not to have anything to eat or drink 6 hours prior to your appointment. Should you need to reschedule your appointment, please contact radiology at [phone]. This test typically takes about 30 minutes to perform.  Your physician has requested that you go to the basement for lab work before leaving today  We have sent the following medications to your pharmacy for you to pick up at your convenience:  Omeprazole  You have been given some samples of Nexium, take one a day, 30 minutes before breakfast

## 2012-10-21 NOTE — Progress Notes (Signed)
HISTORY OF PRESENT ILLNESS:  [name] is a 57 y.o. female with INNUMERABLE significant medical problems as listed below. Her history is quite complex. 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 Also GERD and colon polyp surveillance. She has had previous GI care elsewhere. She was last seen in the office 09/19/2011. Prior to that 01/17/2009. She has been lost to followup at one point. She presents today for routine followup as requested. Next time of her last visit laboratories were stable. Screening ultrasound revealed cirrhosis with no evidence of mass. Alpha-fetoprotein was normal. Her only-year-old GI problem is worsening GERD as manifested by indigestion. She had been on PPI previously, which helped. On the medication currently. She denies change in mental status, ascites, or GI bleeding. Her last upper endoscopy was May 2013. No varices. Distal esophagitis noted. PPI recommended. Followup endoscopy in 2-3 years recommended. Her last colonoscopy November 2012. Diminutive polyp. Followup in 5 years with prep adjustments and propofol recommended. She continues to smoke  REVIEW OF SYSTEMS:  All non-GI ROS negative except for anxiety, back pain, depression, cough, arthritis, insomnia,  Past Medical History  Diagnosis Date  . 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 Degeneration of lumbar or lumbosacral intervertebral disc   . Dermatophytosis of the body   . Chronic pain syndrome   . Edema   . Insomnia, unspecified   . Unspecified hypertrophic and atrophic condition of skin   . Cerebral aneurysm, nonruptured   . Unspecified hereditary and idiopathic peripheral neuropathy   . Tobacco use disorder   . Unspecified urinary incontinence   . Migraine, unspecified, without mention of intractable migraine without mention of status migrainosus   . Urinary tract infection, site not specified   . Carpal tunnel syndrome   . Unspecified  sleep apnea     NPSG 01-09-09 AHI 6.2, RDI 13  . Restless legs syndrome (RLS)   . Myalgia and myositis, unspecified   . Arthropathy, unspecified, site unspecified   . Peptic ulcer, unspecified site, unspecified as acute or chronic, without mention of hemorrhage, perforation, or obstruction   . Bronchitis, not specified as acute or chronic   . Osteoarthrosis, unspecified whether generalized or localized, unspecified site   . Unspecified essential hypertension   . Other and unspecified hyperlipidemia   . Esophageal reflux   . Type II or unspecified type diabetes mellitus without mention of complication, not stated as uncontrolled   . Depressive disorder, not elsewhere classified   . Unspecified asthma   . Anxiety state, unspecified   . Allergic rhinitis, cause unspecified     Skin Test POS 06-26-09  . Obesity   . Cirrhosis   . Colon polyp   . Anemia     Past Surgical History  Procedure Laterality Date  . Bladder lift  1993  . Cerebreal anuerysm  2008  . Tonsillectomy  age 9  . Laproscopic surgery abdomen for intestine infection  1984  . Carpal tunnel release  1996 2009    x 2 bilateral  . Partial hysterectomy  age 76    unlcear why this was done  . Colonoscopy    . Total knee arthroplasty      right  . Clavicle surgery      fracture    Social History Damiah L Wirkkala  reports that she has been smoking Cigarettes.  She has been smoking about 0.50 packs per day. She has never used smokeless tobacco. She  reports that she does not drink alcohol or use illicit drugs.  family history includes Alzheimer's disease in her father; Breast cancer in her maternal aunt; COPD in her mother; Colon polyps in her mother; Diabetes in her father and mother; Heart disease in her maternal aunt and mother; Irritable bowel syndrome in her mother; Lung cancer in her mother; Ovarian cancer in her maternal aunt; and Uterine cancer in her mother.  Allergies  Allergen Reactions  . Bupropion Hcl      REACTION: Rash, Dizzy  . Erythromycin     REACTION: Rash  . Haloperidol Lactate     REACTION: Anxious, Tardive dyskinesea  . Latex     REACTION: Rash and hives  . Neomycin-Bacitracin Zn-Polymyx     REACTION: "Puss in wounds" - can use bacitracin  . Tape        PHYSICAL EXAMINATION: Vital signs: BP 140/78  Pulse 92  Ht 5\' 4"  (1.626 m)  Wt 201 lb (91.173 kg)  BMI 34.48 kg/m2  Constitutional: Obese, chronically ill-appearing, no acute distress Psychiatric: alert and oriented x3, cooperative Eyes: extraocular movements intact, anicteric, conjunctiva pink Mouth: oral pharynx moist, no lesions Neck: supple no lymphadenopathy Cardiovascular: heart regular rate and rhythm, no murmur Lungs: clear to auscultation bilaterally Abdomen: soft, obese, nontender, nondistended, no obvious ascites, no peritoneal signs, normal bowel sounds, no organomegaly Rectal: Omitted Extremities: Trace lower extremity edema bilaterally Skin: no lesions on visible extremities Neuro: No focal deficits. No asterixis.    ASSESSMENT:  #1. 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 Portal hypertension. No varices on upper endoscopy 2013 #3. GERD with a history of esophagitis. Active symptoms currently with out medical therapy #4. History of adenomatous colon polyps. Last colonoscopy November 2012 #5. Multiple significant medical problems   PLAN:  #1. Laboratories today including CBC, comprehensive metabolic panel, PT/INR, alpha-fetoprotein #2. Liver/abdominal ultrasound #3. Prescribe omeprazole 40 mg daily for GERD. Also, 20 days of Nexium samples provided. #4. Reflux precautions with attention to weight loss and discontinuation of smoking. Advised #5. Surveillance colonoscopy November 2017 #6. Resume general medical care with PCP #7. Routine GI followup in 1 year. Sooner if needed

## 2012-10-22 ENCOUNTER — Ambulatory Visit (HOSPITAL_COMMUNITY): Payer: Medicare Other

## 2012-10-22 LAB — AFP TUMOR MARKER: AFP-Tumor Marker: 6.1 ng/mL (ref 0.0–8.0)

## 2012-10-23 ENCOUNTER — Ambulatory Visit (HOSPITAL_COMMUNITY)
Admission: RE | Admit: 2012-10-23 | Discharge: 2012-10-23 | Disposition: A | Discharge status: Home / Self Care | Payer: Medicare Other | Source: Ambulatory Visit | Attending: Internal Medicine | Admitting: Internal Medicine

## 2012-10-23 DIAGNOSIS — K219 Gastro-esophageal reflux disease without esophagitis: Secondary | ICD-10-CM

## 2012-10-23 DIAGNOSIS — R161 Splenomegaly, not elsewhere classified: Secondary | ICD-10-CM | POA: Insufficient documentation

## 2012-10-23 DIAGNOSIS — K7460 Unspecified cirrhosis of liver: Secondary | ICD-10-CM | POA: Insufficient documentation

## 2012-10-26 DIAGNOSIS — D1039 Benign neoplasm of other parts of mouth: Secondary | ICD-10-CM | POA: Insufficient documentation

## 2012-11-04 DIAGNOSIS — R413 Other amnesia: Secondary | ICD-10-CM | POA: Insufficient documentation

## 2012-11-04 DIAGNOSIS — Z79899 Other long term (current) drug therapy: Secondary | ICD-10-CM | POA: Insufficient documentation

## 2012-11-04 DIAGNOSIS — Z515 Encounter for palliative care: Secondary | ICD-10-CM | POA: Insufficient documentation

## 2012-11-18 DIAGNOSIS — R490 Dysphonia: Secondary | ICD-10-CM | POA: Insufficient documentation

## 2012-11-30 DIAGNOSIS — E1140 Type 2 diabetes mellitus with diabetic neuropathy, unspecified: Secondary | ICD-10-CM | POA: Insufficient documentation

## 2012-11-30 DIAGNOSIS — M129 Arthropathy, unspecified: Secondary | ICD-10-CM | POA: Insufficient documentation

## 2012-12-04 DIAGNOSIS — G43909 Migraine, unspecified, not intractable, without status migrainosus: Secondary | ICD-10-CM | POA: Insufficient documentation

## 2013-01-29 ENCOUNTER — Emergency Department (HOSPITAL_COMMUNITY): Payer: Medicare Other

## 2013-01-29 ENCOUNTER — Encounter (HOSPITAL_COMMUNITY): Payer: Self-pay | Admitting: *Deleted

## 2013-01-29 ENCOUNTER — Emergency Department (HOSPITAL_COMMUNITY)
Admission: EM | Admit: 2013-01-29 | Discharge: 2013-01-30 | Disposition: A | Discharge status: Home / Self Care | Payer: Medicare Other | Attending: Emergency Medicine | Admitting: Emergency Medicine

## 2013-01-29 DIAGNOSIS — R319 Hematuria, unspecified: Secondary | ICD-10-CM | POA: Insufficient documentation

## 2013-01-29 DIAGNOSIS — IMO0001 Reserved for inherently not codable concepts without codable children: Secondary | ICD-10-CM | POA: Insufficient documentation

## 2013-01-29 DIAGNOSIS — R3915 Urgency of urination: Secondary | ICD-10-CM | POA: Insufficient documentation

## 2013-01-29 DIAGNOSIS — J45909 Unspecified asthma, uncomplicated: Secondary | ICD-10-CM | POA: Insufficient documentation

## 2013-01-29 DIAGNOSIS — Z8669 Personal history of other diseases of the nervous system and sense organs: Secondary | ICD-10-CM | POA: Insufficient documentation

## 2013-01-29 DIAGNOSIS — F3289 Other specified depressive episodes: Secondary | ICD-10-CM | POA: Insufficient documentation

## 2013-01-29 DIAGNOSIS — Z860100 Personal history of colon polyps, unspecified: Secondary | ICD-10-CM | POA: Insufficient documentation

## 2013-01-29 DIAGNOSIS — G894 Chronic pain syndrome: Secondary | ICD-10-CM | POA: Insufficient documentation

## 2013-01-29 DIAGNOSIS — F329 Major depressive disorder, single episode, unspecified: Secondary | ICD-10-CM | POA: Insufficient documentation

## 2013-01-29 DIAGNOSIS — G609 Hereditary and idiopathic neuropathy, unspecified: Secondary | ICD-10-CM | POA: Insufficient documentation

## 2013-01-29 DIAGNOSIS — G4700 Insomnia, unspecified: Secondary | ICD-10-CM | POA: Insufficient documentation

## 2013-01-29 DIAGNOSIS — F411 Generalized anxiety disorder: Secondary | ICD-10-CM | POA: Insufficient documentation

## 2013-01-29 DIAGNOSIS — IMO0002 Reserved for concepts with insufficient information to code with codable children: Secondary | ICD-10-CM | POA: Insufficient documentation

## 2013-01-29 DIAGNOSIS — I10 Essential (primary) hypertension: Secondary | ICD-10-CM | POA: Insufficient documentation

## 2013-01-29 DIAGNOSIS — Z8711 Personal history of peptic ulcer disease: Secondary | ICD-10-CM | POA: Insufficient documentation

## 2013-01-29 DIAGNOSIS — M1990 Unspecified osteoarthritis, unspecified site: Secondary | ICD-10-CM | POA: Insufficient documentation

## 2013-01-29 DIAGNOSIS — E785 Hyperlipidemia, unspecified: Secondary | ICD-10-CM | POA: Insufficient documentation

## 2013-01-29 DIAGNOSIS — Z87440 Personal history of urinary (tract) infections: Secondary | ICD-10-CM | POA: Insufficient documentation

## 2013-01-29 DIAGNOSIS — R350 Frequency of micturition: Secondary | ICD-10-CM | POA: Insufficient documentation

## 2013-01-29 DIAGNOSIS — D649 Anemia, unspecified: Secondary | ICD-10-CM | POA: Insufficient documentation

## 2013-01-29 DIAGNOSIS — G2581 Restless legs syndrome: Secondary | ICD-10-CM | POA: Insufficient documentation

## 2013-01-29 DIAGNOSIS — Z8709 Personal history of other diseases of the respiratory system: Secondary | ICD-10-CM | POA: Insufficient documentation

## 2013-01-29 DIAGNOSIS — Z8719 Personal history of other diseases of the digestive system: Secondary | ICD-10-CM | POA: Insufficient documentation

## 2013-01-29 DIAGNOSIS — Z872 Personal history of diseases of the skin and subcutaneous tissue: Secondary | ICD-10-CM | POA: Insufficient documentation

## 2013-01-29 DIAGNOSIS — E119 Type 2 diabetes mellitus without complications: Secondary | ICD-10-CM | POA: Insufficient documentation

## 2013-01-29 DIAGNOSIS — N390 Urinary tract infection, site not specified: Secondary | ICD-10-CM

## 2013-01-29 DIAGNOSIS — Z8739 Personal history of other diseases of the musculoskeletal system and connective tissue: Secondary | ICD-10-CM | POA: Insufficient documentation

## 2013-01-29 DIAGNOSIS — G4730 Sleep apnea, unspecified: Secondary | ICD-10-CM | POA: Insufficient documentation

## 2013-01-29 DIAGNOSIS — M129 Arthropathy, unspecified: Secondary | ICD-10-CM | POA: Insufficient documentation

## 2013-01-29 DIAGNOSIS — G43909 Migraine, unspecified, not intractable, without status migrainosus: Secondary | ICD-10-CM | POA: Insufficient documentation

## 2013-01-29 DIAGNOSIS — Z79899 Other long term (current) drug therapy: Secondary | ICD-10-CM | POA: Insufficient documentation

## 2013-01-29 DIAGNOSIS — Z87448 Personal history of other diseases of urinary system: Secondary | ICD-10-CM | POA: Insufficient documentation

## 2013-01-29 DIAGNOSIS — R300 Dysuria: Secondary | ICD-10-CM | POA: Insufficient documentation

## 2013-01-29 DIAGNOSIS — M549 Dorsalgia, unspecified: Secondary | ICD-10-CM | POA: Insufficient documentation

## 2013-01-29 DIAGNOSIS — F17200 Nicotine dependence, unspecified, uncomplicated: Secondary | ICD-10-CM | POA: Insufficient documentation

## 2013-01-29 DIAGNOSIS — E669 Obesity, unspecified: Secondary | ICD-10-CM | POA: Insufficient documentation

## 2013-01-29 DIAGNOSIS — Z8619 Personal history of other infectious and parasitic diseases: Secondary | ICD-10-CM | POA: Insufficient documentation

## 2013-01-29 DIAGNOSIS — K219 Gastro-esophageal reflux disease without esophagitis: Secondary | ICD-10-CM | POA: Insufficient documentation

## 2013-01-29 DIAGNOSIS — G8929 Other chronic pain: Secondary | ICD-10-CM | POA: Insufficient documentation

## 2013-01-29 LAB — CBC WITH DIFFERENTIAL/PLATELET
Basophils Absolute: 0 10*3/uL (ref 0.0–0.1)
Basophils Relative: 1 % (ref 0–1)
Eosinophils Absolute: 0.3 10*3/uL (ref 0.0–0.7)
Eosinophils Relative: 5 % (ref 0–5)
HCT: 36.7 % (ref 36.0–46.0)
Hemoglobin: 12.1 g/dL (ref 12.0–15.0)
Lymphocytes Relative: 29 % (ref 12–46)
Lymphs Abs: 1.6 10*3/uL (ref 0.7–4.0)
MCH: 29.4 pg (ref 26.0–34.0)
MCHC: 33 g/dL (ref 30.0–36.0)
MCV: 89.3 fL (ref 78.0–100.0)
Monocytes Absolute: 0.4 10*3/uL (ref 0.1–1.0)
Monocytes Relative: 8 % (ref 3–12)
Neutro Abs: 3.1 10*3/uL (ref 1.7–7.7)
Neutrophils Relative %: 58 % (ref 43–77)
Platelets: 72 10*3/uL — ABNORMAL LOW (ref 150–400)
RBC: 4.11 MIL/uL (ref 3.87–5.11)
RDW: 13.6 % (ref 11.5–15.5)
WBC: 5.4 10*3/uL (ref 4.0–10.5)

## 2013-01-29 LAB — URINALYSIS, ROUTINE W REFLEX MICROSCOPIC
Bilirubin Urine: NEGATIVE
Glucose, UA: NEGATIVE mg/dL
Ketones, ur: NEGATIVE mg/dL
Nitrite: NEGATIVE
Protein, ur: 30 mg/dL — AB
Specific Gravity, Urine: 1.01 (ref 1.005–1.030)
Urobilinogen, UA: 0.2 mg/dL (ref 0.0–1.0)
pH: 6.5 (ref 5.0–8.0)

## 2013-01-29 LAB — BASIC METABOLIC PANEL
BUN: 13 mg/dL (ref 6–23)
CO2: 25 mEq/L (ref 19–32)
Calcium: 9.4 mg/dL (ref 8.4–10.5)
Chloride: 108 mEq/L (ref 96–112)
Creatinine, Ser: 0.94 mg/dL (ref 0.50–1.10)
GFR calc Af Amer: 77 mL/min — ABNORMAL LOW (ref >90)
GFR calc non Af Amer: 66 mL/min — ABNORMAL LOW (ref >90)
Glucose, Bld: 138 mg/dL — ABNORMAL HIGH (ref 70–99)
Potassium: 3.4 mEq/L — ABNORMAL LOW (ref 3.5–5.1)
Sodium: 141 mEq/L (ref 135–145)

## 2013-01-29 LAB — URINE MICROSCOPIC-ADD ON

## 2013-01-29 LAB — LACTIC ACID, PLASMA: Lactic Acid, Venous: 1.4 mmol/L (ref 0.5–2.2)

## 2013-01-29 MED ORDER — CEPHALEXIN 500 MG PO CAPS
500.0000 mg | ORAL_CAPSULE | Freq: Once | ORAL | Status: AC
  Administered 2013-01-29: 500 mg via ORAL
  Filled 2013-01-29: qty 1

## 2013-01-29 MED ORDER — CEPHALEXIN 500 MG PO CAPS: 500.0000 mg | ORAL_CAPSULE | Freq: Four times a day (QID) | ORAL | Status: DC

## 2013-01-29 MED ORDER — PHENAZOPYRIDINE HCL 200 MG PO TABS: 200.0000 mg | ORAL_TABLET | Freq: Once | ORAL | Status: DC

## 2013-01-29 MED ORDER — PHENAZOPYRIDINE HCL 100 MG PO TABS
200.0000 mg | ORAL_TABLET | Freq: Once | ORAL | Status: AC
  Administered 2013-01-29: 200 mg via ORAL
  Filled 2013-01-29: qty 2

## 2013-01-29 MED ORDER — SODIUM CHLORIDE 0.9 % IV BOLUS (SEPSIS)
1000.0000 mL | Freq: Once | INTRAVENOUS | Status: AC
  Administered 2013-01-29: 1000 mL via INTRAVENOUS

## 2013-01-29 NOTE — ED Notes (Signed)
In to discharge pt. EDA in to reeval. Pt does not want to go home. She want to be admitted for a few days.

## 2013-01-29 NOTE — ED Notes (Signed)
Mid to lower back pain x 3 wks.  Hematuria started today.

## 2013-01-30 NOTE — ED Provider Notes (Signed)
CSN: [id number]     Arrival date & time 01/29/13  1447 History     First MD Initiated Contact with Patient 01/29/13 1554     Chief Complaint  Patient presents with  . Back Pain  . Hematuria   (Consider location/radiation/quality/duration/timing/severity/associated sxs/prior Treatment) HPI Comments: [name] is a 57 y.o. Female presenting with acute on chronic mid to lower midline back pain which has been worse for the past 2-3 weeks, then today developed dysuria and hematuria along with increased urinary frequency. She has a history of urosepsis last year which she states sent her to the ICU for a week.  She denies fevers, nausea, vomiting, weakness dizziness and abdominal pain.  She also denies weakness or numbness in her lower extremities since her back pain has worsened,  But presents in her husbands wheelchair stating it hurts too much to walk.  Her past medical history is also significant for diabetes and  liver cirrhosis.  Her blood glucose levels have been well controlled per her report. She denies any other spontaneous bleeding or unexplained bruising.     The history is provided by the patient and the spouse.    Past Medical History  Diagnosis Date  . 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 Degeneration of lumbar or lumbosacral intervertebral disc   . Dermatophytosis of the body   . Chronic pain syndrome   . Edema   . Insomnia, unspecified   . Unspecified hypertrophic and atrophic condition of skin   . Cerebral aneurysm, nonruptured   . Unspecified hereditary and idiopathic peripheral neuropathy   . Tobacco use disorder   . Unspecified urinary incontinence   . Migraine, unspecified, without mention of intractable migraine without mention of status migrainosus   . Urinary tract infection, site not specified   . Carpal tunnel syndrome   . Unspecified sleep apnea     NPSG 01-09-09 AHI 6.2, RDI 13  . Restless legs syndrome (RLS)   . Myalgia  and myositis, unspecified   . Arthropathy, unspecified, site unspecified   . Peptic ulcer, unspecified site, unspecified as acute or chronic, without mention of hemorrhage, perforation, or obstruction   . Bronchitis, not specified as acute or chronic   . Osteoarthrosis, unspecified whether generalized or localized, unspecified site   . Unspecified essential hypertension   . Other and unspecified hyperlipidemia   . Esophageal reflux   . Type II or unspecified type diabetes mellitus without mention of complication, not stated as uncontrolled   . Depressive disorder, not elsewhere classified   . Unspecified asthma(493.90)   . Anxiety state, unspecified   . Allergic rhinitis, cause unspecified     Skin Test POS 06-26-09  . Obesity   . Cirrhosis   . Colon polyp   . Anemia    Past Surgical History  Procedure Laterality Date  . Bladder lift  1993  . Cerebreal anuerysm  2008  . Tonsillectomy  age 44  . Laproscopic surgery abdomen for intestine infection  1984  . Carpal tunnel release  1996 2009    x 2 bilateral  . Partial hysterectomy  age 80    unlcear why this was done  . Colonoscopy    . Total knee arthroplasty      right  . Clavicle surgery      fracture   Family History  Problem Relation Age of Onset  . Alzheimer's disease Father     CVA  . Uterine cancer Mother   .  Lung cancer Mother   . COPD Mother   . Heart disease Mother   . Heart disease Maternal Aunt     x 3 aunt  . Ovarian cancer Maternal Aunt   . Breast cancer Maternal Aunt   . Colon polyps Mother   . Diabetes Mother   . Diabetes Father   . Irritable bowel syndrome Mother     maternal aunts x 2   History  Substance Use Topics  . Smoking status: Current Every Day Smoker -- 0.50 packs/day    Types: Cigarettes  . Smokeless tobacco: Never Used     Comment: Using patches  . Alcohol Use: No     Comment: rarely- glass on wine   OB History   Grav Para Term Preterm Abortions TAB SAB Ect Mult Living                  Review of Systems  Constitutional: Negative for fever and chills.  HENT: Negative for congestion, sore throat and neck pain.   Eyes: Negative.   Respiratory: Negative for chest tightness and shortness of breath.   Cardiovascular: Negative for chest pain.  Gastrointestinal: Negative for nausea, abdominal pain and diarrhea.  Genitourinary: Positive for dysuria, urgency, frequency and hematuria.  Musculoskeletal: Positive for back pain and arthralgias. Negative for joint swelling.  Skin: Negative.  Negative for rash and wound.  Neurological: Negative for dizziness, weakness, light-headedness, numbness and headaches.  Psychiatric/Behavioral: Negative.     Allergies  Bupropion hcl; Erythromycin; Haloperidol lactate; Latex; Neomycin-bacitracin zn-polymyx; and Tape  Home Medications   Current Outpatient Rx  Name  Route  Sig  Dispense  Refill  . albuterol (PROAIR HFA) 108 (90 BASE) MCG/ACT inhaler   Inhalation   Inhale 2 puffs into the lungs every 4 (four) hours as needed. For shortness of breath         . Calcium Carbonate-Vitamin D (CALCIUM 600+D PO)   Oral   Take 3 tablets by mouth daily.         Marland Kitchen dihydroergotamine (MIGRANAL) 4 MG/ML nasal spray   Nasal   Place 1 spray into the nose as needed. Use in one nostril as directed.  No more than 4 sprays in one hour for migraines.         Marland Kitchen esomeprazole (NEXIUM) 40 MG capsule   Oral   Take 1 capsule (40 mg total) by mouth daily before breakfast.   20 capsule   0     Samples given to patient    Lot#  [id number]          ...   . ferrous sulfate 325 (65 FE) MG tablet   Oral   Take 325 mg by mouth daily with breakfast.         . gabapentin (NEURONTIN) 400 MG capsule   Oral   Take 400 mg by mouth 3 (three) times daily.          . hydrOXYzine (VISTARIL) 50 MG capsule   Oral   Take 50 mg by mouth 2 (two) times daily as needed for anxiety. 50 mg in the morning as needed  50 mg at bedtime as needed         .  meclizine (ANTIVERT) 25 MG tablet   Oral   Take 25 mg by mouth 3 (three) times daily as needed. For dizziness.         . Melatonin 5 MG TABS   Oral   Take 1 tablet by mouth  at bedtime as needed. For sleep.         . mirtazapine (REMERON) 15 MG tablet   Oral   Take 15 mg by mouth daily. Uses as directed         . Multiple Vitamin (MULTIVITAMIN WITH MINERALS) TABS   Oral   Take 1 tablet by mouth daily.         . nitroGLYCERIN (NITROSTAT) 0.4 MG SL tablet   Sublingual   Place 1 tablet (0.4 mg total) under the tongue every 5 (five) minutes as needed for chest pain.   30 tablet   0   . NUCYNTA ER 50 MG TB12   Oral   Take 1 tablet by mouth 2 (two) times daily. Takes as directed         . omeprazole (PRILOSEC) 40 MG capsule   Oral   Take 1 capsule (40 mg total) by mouth daily.   30 capsule   11   . ondansetron (ZOFRAN) 4 MG tablet   Oral   Take 4 mg by mouth every 8 (eight) hours as needed for nausea.          Marland Kitchen PRESCRIPTION MEDICATION   Subcutaneous   Inject 1 Syringe into the skin daily as needed. For migraines.  DHE 45         . ropinirole (REQUIP) 5 MG tablet   Oral   Take 5 mg by mouth at bedtime. For RLS.         Marland Kitchen tiZANidine (ZANAFLEX) 4 MG tablet   Oral   Take 4 mg by mouth every 8 (eight) hours as needed (muscle spasms). Every 6 hours as needed         . topiramate (TOPAMAX) 200 MG tablet   Oral   Take 400 mg by mouth at bedtime.          . traZODone (DESYREL) 150 MG tablet   Oral   Take 150 mg by mouth at bedtime as needed for sleep.          Marland Kitchen venlafaxine XR (EFFEXOR-XR) 75 MG 24 hr capsule   Oral   Take 75 mg by mouth 2 (two) times daily. Twice daily         . benztropine (COGENTIN) 1 MG tablet   Oral   Take 1 mg by mouth 2 (two) times daily.         . cephALEXin (KEFLEX) 500 MG capsule   Oral   Take 1 capsule (500 mg total) by mouth 4 (four) times daily.   28 capsule   0   . phenazopyridine (PYRIDIUM) 200 MG  tablet   Oral   Take 1 tablet (200 mg total) by mouth once.   6 tablet   0    BP 134/73  Pulse 83  Temp(Src) 98.4 F (36.9 C) (Rectal)  Resp 20  Ht 5\' 4"  (1.626 m)  Wt 200 lb (90.719 kg)  BMI 34.31 kg/m2  SpO2 99% Physical Exam  Nursing note and vitals reviewed. Constitutional: She appears well-developed and well-nourished. She does not appear ill.  Obese. Anxious.  HENT:  Head: Normocephalic and atraumatic.  Eyes: Conjunctivae are normal.  Neck: Normal range of motion.  Cardiovascular: Normal rate, regular rhythm, normal heart sounds and intact distal pulses.   Pulmonary/Chest: Effort normal and breath sounds normal. She has no wheezes.  Abdominal: Soft. Bowel sounds are normal. There is no tenderness. There is no CVA tenderness.  Musculoskeletal: Normal range of motion.  Lumbar back: She exhibits bony tenderness. She exhibits no swelling and no edema.  TTP along spinous processes from mid thorax through sacrum.  No cva tenderness.  She also has discomfort to palpation bilateral paralumbar soft tissue without spasm.  Neurological: She is alert.  Skin: Skin is warm and dry.  Psychiatric: She has a normal mood and affect.    ED Course   Procedures (including critical care time)  Labs Reviewed  URINALYSIS, ROUTINE W REFLEX MICROSCOPIC - Abnormal; Notable for the following:    Hgb urine dipstick LARGE (*)    Protein, ur 30 (*)    Leukocytes, UA LARGE (*)    All other components within normal limits  URINE MICROSCOPIC-ADD ON - Abnormal; Notable for the following:    Bacteria, UA MANY (*)    All other components within normal limits  CBC WITH DIFFERENTIAL - Abnormal; Notable for the following:    Platelets 72 (*)    All other components within normal limits  BASIC METABOLIC PANEL - Abnormal; Notable for the following:    Potassium 3.4 (*)    Glucose, Bld 138 (*)    GFR calc non Af Amer 66 (*)    GFR calc Af Amer 77 (*)    All other components within normal  limits  URINE CULTURE  LACTIC ACID, PLASMA   Dg Thoracic Spine 2 View  01/29/2013   CLINICAL DATA:  57 year old female with back pain. Hematuria.  EXAM: THORACIC SPINE - 2 VIEW  COMPARISON:  Thoracic spine CT 12/17/2010.  FINDINGS: Spinal stimulator device centered at the T8 level re- identified. Normal thoracic segmentation. Stable vertebral height and alignment. Lower thoracic disc and endplate degeneration again noted. Cervicothoracic junction alignment is within normal limits.  Posterior ribs appear intact. Grossly negative visualized thoracic visceral contours.  IMPRESSION: No acute osseous abnormality in the thoracic spine. Stable spinal stimulator leads centered at the T8 level.   Electronically Signed   By: Augusto Gamble   On: 01/29/2013 16:35   Dg Lumbar Spine Complete  01/29/2013   CLINICAL DATA:  57 year old female with back pain. History of neural stimulator.  EXAM: LUMBAR SPINE - COMPLETE 4+ VIEW  COMPARISON:  08/09/2012 and earlier. Thoracic spine radiographs from the same day reported separately.  FINDINGS: Partially visible thoracic spinal stimulator leads and generator device. Normal lumbar segmentation. Stable lumbar vertebral height and alignment. Mild grade 1 anterolisthesis at L4-L5. Stable and relatively preserved disc spaces. No pars fracture. Sacral a ala and SI joints within normal limits. Aortoiliac calcified atherosclerosis again noted.  IMPRESSION: No acute osseous abnormality in the lumbar spine.   Electronically Signed   By: Augusto Gamble   On: 01/29/2013 16:37   1. UTI (lower urinary tract infection)     MDM  Patients labs and/or radiological studies were viewed and considered during the medical decision making and disposition process.  Upon discussing lab and xray results, including plan of keflex and pyridium for uti sx, pt became upset that she is not being admitted to the hospital, stating the last time she had a uti she became very sick (urosepsis).  Her doctor (pcp from  [facility]) told her she needed to be admitted (did not see pcp today).  Discussed with Dr. Juleen China who will see patient and dispo per his recommendations.   Burgess Amor, PA-C 01/30/13 [phone]

## 2013-02-01 LAB — URINE CULTURE

## 2013-02-04 NOTE — ED Provider Notes (Signed)
Medical screening examination/treatment/procedure(s) were performed by non-physician practitioner and as supervising physician I was immediately available for consultation/collaboration.  Bradlee Heitman, MD 02/04/13 0639 

## 2013-02-04 NOTE — ED Notes (Signed)
+   urine Patient treated with Cephalexin-sensitive to same-Chart appended per protocol MD

## 2013-02-14 ENCOUNTER — Emergency Department (HOSPITAL_COMMUNITY): Payer: Medicare Other

## 2013-02-14 ENCOUNTER — Emergency Department (HOSPITAL_COMMUNITY)
Admission: EM | Admit: 2013-02-14 | Discharge: 2013-02-14 | Disposition: A | Discharge status: Home / Self Care | Payer: Medicare Other | Attending: Emergency Medicine | Admitting: Emergency Medicine

## 2013-02-14 ENCOUNTER — Encounter (HOSPITAL_COMMUNITY): Payer: Self-pay | Admitting: Emergency Medicine

## 2013-02-14 DIAGNOSIS — Z860100 Personal history of colon polyps, unspecified: Secondary | ICD-10-CM | POA: Insufficient documentation

## 2013-02-14 DIAGNOSIS — G43909 Migraine, unspecified, not intractable, without status migrainosus: Secondary | ICD-10-CM | POA: Insufficient documentation

## 2013-02-14 DIAGNOSIS — F3289 Other specified depressive episodes: Secondary | ICD-10-CM | POA: Insufficient documentation

## 2013-02-14 DIAGNOSIS — Y9389 Activity, other specified: Secondary | ICD-10-CM | POA: Insufficient documentation

## 2013-02-14 DIAGNOSIS — Z8619 Personal history of other infectious and parasitic diseases: Secondary | ICD-10-CM | POA: Insufficient documentation

## 2013-02-14 DIAGNOSIS — Y9289 Other specified places as the place of occurrence of the external cause: Secondary | ICD-10-CM | POA: Insufficient documentation

## 2013-02-14 DIAGNOSIS — E669 Obesity, unspecified: Secondary | ICD-10-CM | POA: Insufficient documentation

## 2013-02-14 DIAGNOSIS — E119 Type 2 diabetes mellitus without complications: Secondary | ICD-10-CM | POA: Insufficient documentation

## 2013-02-14 DIAGNOSIS — Z8739 Personal history of other diseases of the musculoskeletal system and connective tissue: Secondary | ICD-10-CM | POA: Insufficient documentation

## 2013-02-14 DIAGNOSIS — K219 Gastro-esophageal reflux disease without esophagitis: Secondary | ICD-10-CM | POA: Insufficient documentation

## 2013-02-14 DIAGNOSIS — S29019A Strain of muscle and tendon of unspecified wall of thorax, initial encounter: Secondary | ICD-10-CM

## 2013-02-14 DIAGNOSIS — Z8669 Personal history of other diseases of the nervous system and sense organs: Secondary | ICD-10-CM | POA: Insufficient documentation

## 2013-02-14 DIAGNOSIS — IMO0001 Reserved for inherently not codable concepts without codable children: Secondary | ICD-10-CM | POA: Insufficient documentation

## 2013-02-14 DIAGNOSIS — Z79899 Other long term (current) drug therapy: Secondary | ICD-10-CM | POA: Insufficient documentation

## 2013-02-14 DIAGNOSIS — J45909 Unspecified asthma, uncomplicated: Secondary | ICD-10-CM | POA: Insufficient documentation

## 2013-02-14 DIAGNOSIS — F411 Generalized anxiety disorder: Secondary | ICD-10-CM | POA: Insufficient documentation

## 2013-02-14 DIAGNOSIS — I10 Essential (primary) hypertension: Secondary | ICD-10-CM | POA: Insufficient documentation

## 2013-02-14 DIAGNOSIS — F17200 Nicotine dependence, unspecified, uncomplicated: Secondary | ICD-10-CM | POA: Insufficient documentation

## 2013-02-14 DIAGNOSIS — Z862 Personal history of diseases of the blood and blood-forming organs and certain disorders involving the immune mechanism: Secondary | ICD-10-CM | POA: Insufficient documentation

## 2013-02-14 DIAGNOSIS — S239XXA Sprain of unspecified parts of thorax, initial encounter: Secondary | ICD-10-CM | POA: Insufficient documentation

## 2013-02-14 DIAGNOSIS — D649 Anemia, unspecified: Secondary | ICD-10-CM | POA: Insufficient documentation

## 2013-02-14 DIAGNOSIS — W050XXA Fall from non-moving wheelchair, initial encounter: Secondary | ICD-10-CM | POA: Insufficient documentation

## 2013-02-14 DIAGNOSIS — Z8711 Personal history of peptic ulcer disease: Secondary | ICD-10-CM | POA: Insufficient documentation

## 2013-02-14 DIAGNOSIS — Z87440 Personal history of urinary (tract) infections: Secondary | ICD-10-CM | POA: Insufficient documentation

## 2013-02-14 DIAGNOSIS — F329 Major depressive disorder, single episode, unspecified: Secondary | ICD-10-CM | POA: Insufficient documentation

## 2013-02-14 DIAGNOSIS — Z8639 Personal history of other endocrine, nutritional and metabolic disease: Secondary | ICD-10-CM | POA: Insufficient documentation

## 2013-02-14 DIAGNOSIS — Z91040 Latex allergy status: Secondary | ICD-10-CM | POA: Insufficient documentation

## 2013-02-14 MED ORDER — TRAMADOL HCL 50 MG PO TABS
50.0000 mg | ORAL_TABLET | Freq: Once | ORAL | Status: AC
  Administered 2013-02-14: 50 mg via ORAL
  Filled 2013-02-14: qty 1

## 2013-02-14 NOTE — ED Provider Notes (Signed)
CSN: [id number]     Arrival date & time 02/14/13  2007 History   First MD Initiated Contact with Patient 02/14/13 2024     Chief Complaint  Patient presents with  . Fall   (Consider location/radiation/quality/duration/timing/severity/associated sxs/prior Treatment) HPI Comments: Patient with history of degenerative disc with a history of chronic pain. She is followed by pain management for this and is under a pain medication contract. She presents this evening after falling while attempting to climb into a wheelchair at [employer]. She states that this caused her to injure her thoracic spine. She is complaining of pain in the lower thoracic region. There is no rib pain, no shortness of breath. There are no bowel or bladder complaints.  Patient is a 57 y.o. female presenting with fall. The history is provided by the patient.  Fall This is a new problem. The current episode started 1 to 2 hours ago. The problem occurs constantly. The problem has not changed since onset.Associated symptoms comments: Upper back pain. The symptoms are aggravated by walking, bending and twisting. Nothing relieves the symptoms. She has tried nothing for the symptoms. The treatment provided no relief.    Past Medical History  Diagnosis Date  . 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 Degeneration of lumbar or lumbosacral intervertebral disc   . Dermatophytosis of the body   . Chronic pain syndrome   . Edema   . Insomnia, unspecified   . Unspecified hypertrophic and atrophic condition of skin   . Cerebral aneurysm, nonruptured   . Unspecified hereditary and idiopathic peripheral neuropathy   . Tobacco use disorder   . Unspecified urinary incontinence   . Migraine, unspecified, without mention of intractable migraine without mention of status migrainosus   . Urinary tract infection, site not specified   . Carpal tunnel syndrome   . Unspecified sleep apnea     NPSG 01-09-09 AHI 6.2, RDI 13   . Restless legs syndrome (RLS)   . Myalgia and myositis, unspecified   . Arthropathy, unspecified, site unspecified   . Peptic ulcer, unspecified site, unspecified as acute or chronic, without mention of hemorrhage, perforation, or obstruction   . Bronchitis, not specified as acute or chronic   . Osteoarthrosis, unspecified whether generalized or localized, unspecified site   . Unspecified essential hypertension   . Other and unspecified hyperlipidemia   . Esophageal reflux   . Type II or unspecified type diabetes mellitus without mention of complication, not stated as uncontrolled   . Depressive disorder, not elsewhere classified   . Unspecified asthma(493.90)   . Anxiety state, unspecified   . Allergic rhinitis, cause unspecified     Skin Test POS 06-26-09  . Obesity   . Cirrhosis   . Colon polyp   . Anemia    Past Surgical History  Procedure Laterality Date  . Bladder lift  1993  . Cerebreal anuerysm  2008  . Tonsillectomy  age 57  . Laproscopic surgery abdomen for intestine infection  1984  . Carpal tunnel release  1996 2009    x 2 bilateral  . Partial hysterectomy  age 29    unlcear why this was done  . Colonoscopy    . Total knee arthroplasty      right  . Clavicle surgery      fracture   Family History  Problem Relation Age of Onset  . Alzheimer's disease Father     CVA  . Uterine cancer Mother   . Lung  cancer Mother   . COPD Mother   . Heart disease Mother   . Heart disease Maternal Aunt     x 3 aunt  . Ovarian cancer Maternal Aunt   . Breast cancer Maternal Aunt   . Colon polyps Mother   . Diabetes Mother   . Diabetes Father   . Irritable bowel syndrome Mother     maternal aunts x 2   History  Substance Use Topics  . Smoking status: Current Every Day Smoker -- 0.50 packs/day    Types: Cigarettes  . Smokeless tobacco: Never Used     Comment: Using patches  . Alcohol Use: No     Comment: rarely- glass on wine   OB History   Grav Para Term  Preterm Abortions TAB SAB Ect Mult Living                 Review of Systems  All other systems reviewed and are negative.    Allergies  Bupropion hcl; Erythromycin; Haloperidol lactate; Latex; Neomycin-bacitracin zn-polymyx; and Tape  Home Medications   Current Outpatient Rx  Name  Route  Sig  Dispense  Refill  . albuterol (PROAIR HFA) 108 (90 BASE) MCG/ACT inhaler   Inhalation   Inhale 2 puffs into the lungs every 4 (four) hours as needed. For shortness of breath         . benztropine (COGENTIN) 1 MG tablet   Oral   Take 1 mg by mouth 2 (two) times daily.         . Calcium Carbonate-Vitamin D (CALCIUM 600+D PO)   Oral   Take 3 tablets by mouth daily.         . cephALEXin (KEFLEX) 500 MG capsule   Oral   Take 1 capsule (500 mg total) by mouth 4 (four) times daily.   28 capsule   0   . dihydroergotamine (MIGRANAL) 4 MG/ML nasal spray   Nasal   Place 1 spray into the nose as needed. Use in one nostril as directed.  No more than 4 sprays in one hour for migraines.         Marland Kitchen esomeprazole (NEXIUM) 40 MG capsule   Oral   Take 1 capsule (40 mg total) by mouth daily before breakfast.   20 capsule   0     Samples given to patient    Lot#  [id number]          ...   . ferrous sulfate 325 (65 FE) MG tablet   Oral   Take 325 mg by mouth daily with breakfast.         . gabapentin (NEURONTIN) 400 MG capsule   Oral   Take 400 mg by mouth 3 (three) times daily.          . hydrOXYzine (VISTARIL) 50 MG capsule   Oral   Take 50 mg by mouth 2 (two) times daily as needed for anxiety. 50 mg in the morning as needed  50 mg at bedtime as needed         . meclizine (ANTIVERT) 25 MG tablet   Oral   Take 25 mg by mouth 3 (three) times daily as needed. For dizziness.         . Melatonin 5 MG TABS   Oral   Take 1 tablet by mouth at bedtime as needed. For sleep.         . mirtazapine (REMERON) 15 MG tablet   Oral   Take  15 mg by mouth daily. Uses as directed          . Multiple Vitamin (MULTIVITAMIN WITH MINERALS) TABS   Oral   Take 1 tablet by mouth daily.         . nitroGLYCERIN (NITROSTAT) 0.4 MG SL tablet   Sublingual   Place 1 tablet (0.4 mg total) under the tongue every 5 (five) minutes as needed for chest pain.   30 tablet   0   . NUCYNTA ER 50 MG TB12   Oral   Take 1 tablet by mouth 2 (two) times daily. Takes as directed         . omeprazole (PRILOSEC) 40 MG capsule   Oral   Take 1 capsule (40 mg total) by mouth daily.   30 capsule   11   . ondansetron (ZOFRAN) 4 MG tablet   Oral   Take 4 mg by mouth every 8 (eight) hours as needed for nausea.          . phenazopyridine (PYRIDIUM) 200 MG tablet   Oral   Take 1 tablet (200 mg total) by mouth once.   6 tablet   0   . PRESCRIPTION MEDICATION   Subcutaneous   Inject 1 Syringe into the skin daily as needed. For migraines.  DHE 45         . ropinirole (REQUIP) 5 MG tablet   Oral   Take 5 mg by mouth at bedtime. For RLS.         Marland Kitchen tiZANidine (ZANAFLEX) 4 MG tablet   Oral   Take 4 mg by mouth every 8 (eight) hours as needed (muscle spasms). Every 6 hours as needed         . topiramate (TOPAMAX) 200 MG tablet   Oral   Take 400 mg by mouth at bedtime.          . traZODone (DESYREL) 150 MG tablet   Oral   Take 150 mg by mouth at bedtime as needed for sleep.          Marland Kitchen venlafaxine XR (EFFEXOR-XR) 75 MG 24 hr capsule   Oral   Take 75 mg by mouth 2 (two) times daily. Twice daily          BP 146/55  Pulse 95  Temp(Src) 98.5 F (36.9 C) (Oral)  Resp 20  Ht 5\' 4"  (1.626 m)  Wt 196 lb (88.905 kg)  BMI 33.63 kg/m2  SpO2 98% Physical Exam  Nursing note and vitals reviewed. Constitutional: She is oriented to person, place, and time. She appears well-developed and well-nourished.  HENT:  Head: Normocephalic and atraumatic.  Neck: Normal range of motion. Neck supple.  Cardiovascular: Normal rate, regular rhythm and normal heart sounds.    Pulmonary/Chest: Effort normal and breath sounds normal. No respiratory distress.  Musculoskeletal: Normal range of motion.  There is tenderness to palpation in the soft tissues of the lower thoracic spine. There is no bony tenderness and there are no step-offs. There is no lumbar spine tenderness to palpation and no cervical spine tenderness to palpation. She has full range of motion of her neck without limitation.  Neurological: She is alert and oriented to person, place, and time.  The patellar reflexes are 1+ and symmetrical as are the Achilles reflexes. Strength is 5 out of 5 in the bilateral lower extremities.  Skin: Skin is warm and dry.    ED Course  Procedures (including critical care time) Labs Review Labs Reviewed -  No data to display Imaging Review No results found.  MDM  No diagnosis found. Patient is a 57 year old female with a history of chronic pain and degenerative disc disease. He [facility] to get [facility] a wheelchair at [facility]. She is complaining of pain in her lower thoracic region. X-rays show no evidence of fracture. She was given tramadol and is feeling better. She is under a pain contract with her pain management clinic. She appears fine stable for discharge. She has medications that she takes at home that she can take for her pain. I see no evidence of cauda equina syndrome and no evidence for an emergent cause of her discomfort. I feel as though she is stable for discharge with when necessary followup    Geoffery Lyons, MD 02/14/13 2111

## 2013-02-14 NOTE — ED Notes (Signed)
Patient reports fell off of motorized wheelchair at [employer]. Complaining of mid back pain.

## 2013-02-17 ENCOUNTER — Ambulatory Visit: Payer: Self-pay | Admitting: Internal Medicine

## 2013-02-24 ENCOUNTER — Encounter (HOSPITAL_COMMUNITY): Payer: Self-pay | Admitting: Psychology

## 2013-02-24 ENCOUNTER — Ambulatory Visit (INDEPENDENT_AMBULATORY_CARE_PROVIDER_SITE_OTHER): Payer: Medicare Other | Admitting: Psychology

## 2013-02-24 DIAGNOSIS — F333 Major depressive disorder, recurrent, severe with psychotic symptoms: Secondary | ICD-10-CM
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
## 2013-03-09 ENCOUNTER — Ambulatory Visit (INDEPENDENT_AMBULATORY_CARE_PROVIDER_SITE_OTHER): Payer: Medicare Other | Admitting: Psychiatry

## 2013-03-09 ENCOUNTER — Encounter (HOSPITAL_COMMUNITY): Payer: Self-pay | Admitting: Psychiatry

## 2013-03-09 VITALS — BP 160/70 | Ht 64.0 in | Wt 201.0 lb

## 2013-03-09 DIAGNOSIS — F332 Major depressive disorder, recurrent severe without psychotic features: Secondary | ICD-10-CM

## 2013-03-09 DIAGNOSIS — F4310 Post-traumatic stress disorder, unspecified: Secondary | ICD-10-CM

## 2013-03-09 DIAGNOSIS — F333 Major depressive disorder, recurrent, severe with psychotic symptoms: Secondary | ICD-10-CM

## 2013-03-09 MED ORDER — ALPRAZOLAM 0.5 MG PO TABS: 0.5000 mg | ORAL_TABLET | Freq: Three times a day (TID) | ORAL | Status: DC | PRN

## 2013-03-09 MED ORDER — DULOXETINE HCL 30 MG PO CPEP: 30.0000 mg | ORAL_CAPSULE | Freq: Three times a day (TID) | ORAL | Status: DC
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
## 2013-03-12 ENCOUNTER — Ambulatory Visit (HOSPITAL_COMMUNITY): Payer: Self-pay | Admitting: Psychiatry

## 2013-03-16 ENCOUNTER — Telehealth (HOSPITAL_COMMUNITY): Payer: Self-pay | Admitting: Psychiatry

## 2013-03-16 ENCOUNTER — Ambulatory Visit: Payer: Self-pay | Admitting: Orthopedic Surgery

## 2013-03-16 LAB — BASIC METABOLIC PANEL
Anion Gap: 3 — ABNORMAL LOW (ref 7–16)
Co2: 27 mmol/L (ref 21–32)
Creatinine: 0.97 mg/dL (ref 0.60–1.30)
Glucose: 162 mg/dL — ABNORMAL HIGH (ref 65–99)
Osmolality: 283 (ref 275–301)

## 2013-03-16 LAB — URINALYSIS, COMPLETE
Bilirubin,UR: NEGATIVE
Glucose,UR: NEGATIVE mg/dL (ref 0–75)
Ketone: NEGATIVE
Leukocyte Esterase: NEGATIVE
Nitrite: NEGATIVE
Ph: 6 (ref 4.5–8.0)
Protein: NEGATIVE
RBC,UR: <1 /HPF (ref 0–5)
Specific Gravity: 1.006 (ref 1.003–1.030)
WBC UR: 1 /HPF (ref 0–5)

## 2013-03-16 LAB — HEPATIC FUNCTION PANEL A (ARMC)
Albumin: 3.8 g/dL (ref 3.4–5.0)
Bilirubin,Total: 0.5 mg/dL (ref 0.2–1.0)
SGPT (ALT): 31 U/L (ref 12–78)
Total Protein: 7.1 g/dL (ref 6.4–8.2)

## 2013-03-16 LAB — CBC
HCT: 38.2 % (ref 35.0–47.0)
MCH: 30.6 pg (ref 26.0–34.0)
MCHC: 34.2 g/dL (ref 32.0–36.0)
MCV: 90 fL (ref 80–100)
Platelet: 89 10*3/uL — ABNORMAL LOW (ref 150–440)
RBC: 4.26 10*6/uL (ref 3.80–5.20)
RDW: 13.5 % (ref 11.5–14.5)

## 2013-03-16 LAB — PROTIME-INR: Prothrombin Time: 14.5 secs (ref 11.5–14.7)

## 2013-03-16 LAB — MRSA PCR SCREENING

## 2013-03-17 NOTE — Telephone Encounter (Signed)
Pharmacy needs to fax form

## 2013-03-23 ENCOUNTER — Ambulatory Visit (HOSPITAL_COMMUNITY): Payer: Self-pay | Admitting: Psychology

## 2013-03-26 ENCOUNTER — Telehealth (HOSPITAL_COMMUNITY): Payer: Self-pay | Admitting: Psychiatry

## 2013-03-26 ENCOUNTER — Other Ambulatory Visit (HOSPITAL_COMMUNITY): Payer: Self-pay | Admitting: Psychiatry

## 2013-03-26 MED ORDER — DULOXETINE HCL 60 MG PO CPEP: 60.0000 mg | ORAL_CAPSULE | Freq: Every day | ORAL | Status: DC

## 2013-03-26 MED ORDER — DULOXETINE HCL 30 MG PO CPEP: 30.0000 mg | ORAL_CAPSULE | Freq: Every day | ORAL | Status: DC

## 2013-03-26 NOTE — Telephone Encounter (Signed)
See previous message

## 2013-03-26 NOTE — Telephone Encounter (Signed)
See following messager

## 2013-03-26 NOTE — Telephone Encounter (Signed)
Two scrips sent to pharmacy--one for 60 mg and one for 30 mg cymbalta

## 2013-03-26 NOTE — Telephone Encounter (Signed)
See following note. 

## 2013-03-30 ENCOUNTER — Ambulatory Visit (INDEPENDENT_AMBULATORY_CARE_PROVIDER_SITE_OTHER): Payer: Self-pay | Admitting: Psychology

## 2013-03-30 ENCOUNTER — Encounter (HOSPITAL_COMMUNITY): Payer: Self-pay | Admitting: Psychology

## 2013-03-30 DIAGNOSIS — F333 Major depressive disorder, recurrent, severe with psychotic symptoms: Secondary | ICD-10-CM
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
## 2013-04-06 ENCOUNTER — Ambulatory Visit (INDEPENDENT_AMBULATORY_CARE_PROVIDER_SITE_OTHER): Payer: Medicare Other | Admitting: Psychiatry

## 2013-04-06 ENCOUNTER — Encounter (HOSPITAL_COMMUNITY): Payer: Self-pay | Admitting: Psychiatry

## 2013-04-06 VITALS — BP 130/80 | Ht 64.0 in | Wt 201.0 lb

## 2013-04-06 DIAGNOSIS — F332 Major depressive disorder, recurrent severe without psychotic features: Secondary | ICD-10-CM

## 2013-04-06 DIAGNOSIS — F4310 Post-traumatic stress disorder, unspecified: Secondary | ICD-10-CM

## 2013-04-06 DIAGNOSIS — F333 Major depressive disorder, recurrent, severe with psychotic symptoms: Secondary | ICD-10-CM

## 2013-04-06 MED ORDER — VENLAFAXINE HCL ER 75 MG PO CP24: 75.0000 mg | ORAL_CAPSULE | Freq: Two times a day (BID) | ORAL | Status: DC
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
## 2013-04-13 ENCOUNTER — Emergency Department (HOSPITAL_COMMUNITY)
Admission: EM | Admit: 2013-04-13 | Discharge: 2013-04-13 | Disposition: A | Discharge status: Home / Self Care | Payer: Medicare Other | Attending: Emergency Medicine | Admitting: Emergency Medicine

## 2013-04-13 ENCOUNTER — Ambulatory Visit (HOSPITAL_COMMUNITY): Payer: Self-pay | Admitting: Psychology

## 2013-04-13 ENCOUNTER — Encounter (HOSPITAL_COMMUNITY): Payer: Self-pay | Admitting: Emergency Medicine

## 2013-04-13 ENCOUNTER — Emergency Department (HOSPITAL_COMMUNITY): Payer: Medicare Other

## 2013-04-13 DIAGNOSIS — Y929 Unspecified place or not applicable: Secondary | ICD-10-CM | POA: Insufficient documentation

## 2013-04-13 DIAGNOSIS — M1990 Unspecified osteoarthritis, unspecified site: Secondary | ICD-10-CM | POA: Insufficient documentation

## 2013-04-13 DIAGNOSIS — G8929 Other chronic pain: Secondary | ICD-10-CM | POA: Insufficient documentation

## 2013-04-13 DIAGNOSIS — Z872 Personal history of diseases of the skin and subcutaneous tissue: Secondary | ICD-10-CM | POA: Insufficient documentation

## 2013-04-13 DIAGNOSIS — Z87440 Personal history of urinary (tract) infections: Secondary | ICD-10-CM | POA: Insufficient documentation

## 2013-04-13 DIAGNOSIS — Z79899 Other long term (current) drug therapy: Secondary | ICD-10-CM | POA: Insufficient documentation

## 2013-04-13 DIAGNOSIS — D649 Anemia, unspecified: Secondary | ICD-10-CM | POA: Insufficient documentation

## 2013-04-13 DIAGNOSIS — IMO0001 Reserved for inherently not codable concepts without codable children: Secondary | ICD-10-CM | POA: Insufficient documentation

## 2013-04-13 DIAGNOSIS — E119 Type 2 diabetes mellitus without complications: Secondary | ICD-10-CM | POA: Insufficient documentation

## 2013-04-13 DIAGNOSIS — Z91040 Latex allergy status: Secondary | ICD-10-CM | POA: Insufficient documentation

## 2013-04-13 DIAGNOSIS — J45909 Unspecified asthma, uncomplicated: Secondary | ICD-10-CM | POA: Insufficient documentation

## 2013-04-13 DIAGNOSIS — S20222A Contusion of left back wall of thorax, initial encounter: Secondary | ICD-10-CM

## 2013-04-13 DIAGNOSIS — G4730 Sleep apnea, unspecified: Secondary | ICD-10-CM | POA: Insufficient documentation

## 2013-04-13 DIAGNOSIS — K219 Gastro-esophageal reflux disease without esophagitis: Secondary | ICD-10-CM | POA: Insufficient documentation

## 2013-04-13 DIAGNOSIS — Z860100 Personal history of colon polyps, unspecified: Secondary | ICD-10-CM | POA: Insufficient documentation

## 2013-04-13 DIAGNOSIS — S20229A Contusion of unspecified back wall of thorax, initial encounter: Secondary | ICD-10-CM | POA: Insufficient documentation

## 2013-04-13 DIAGNOSIS — Z792 Long term (current) use of antibiotics: Secondary | ICD-10-CM | POA: Insufficient documentation

## 2013-04-13 DIAGNOSIS — E669 Obesity, unspecified: Secondary | ICD-10-CM | POA: Insufficient documentation

## 2013-04-13 DIAGNOSIS — Y939 Activity, unspecified: Secondary | ICD-10-CM | POA: Insufficient documentation

## 2013-04-13 DIAGNOSIS — E785 Hyperlipidemia, unspecified: Secondary | ICD-10-CM | POA: Insufficient documentation

## 2013-04-13 DIAGNOSIS — F411 Generalized anxiety disorder: Secondary | ICD-10-CM | POA: Insufficient documentation

## 2013-04-13 DIAGNOSIS — G4700 Insomnia, unspecified: Secondary | ICD-10-CM | POA: Insufficient documentation

## 2013-04-13 DIAGNOSIS — Z8619 Personal history of other infectious and parasitic diseases: Secondary | ICD-10-CM | POA: Insufficient documentation

## 2013-04-13 DIAGNOSIS — F3289 Other specified depressive episodes: Secondary | ICD-10-CM | POA: Insufficient documentation

## 2013-04-13 DIAGNOSIS — W010XXA Fall on same level from slipping, tripping and stumbling without subsequent striking against object, initial encounter: Secondary | ICD-10-CM | POA: Insufficient documentation

## 2013-04-13 DIAGNOSIS — F17200 Nicotine dependence, unspecified, uncomplicated: Secondary | ICD-10-CM | POA: Insufficient documentation

## 2013-04-13 DIAGNOSIS — F329 Major depressive disorder, single episode, unspecified: Secondary | ICD-10-CM | POA: Insufficient documentation

## 2013-04-13 DIAGNOSIS — G609 Hereditary and idiopathic neuropathy, unspecified: Secondary | ICD-10-CM | POA: Insufficient documentation

## 2013-04-13 DIAGNOSIS — I10 Essential (primary) hypertension: Secondary | ICD-10-CM | POA: Insufficient documentation

## 2013-04-13 DIAGNOSIS — G2581 Restless legs syndrome: Secondary | ICD-10-CM | POA: Insufficient documentation

## 2013-04-13 DIAGNOSIS — Z8711 Personal history of peptic ulcer disease: Secondary | ICD-10-CM | POA: Insufficient documentation

## 2013-04-13 DIAGNOSIS — G43909 Migraine, unspecified, not intractable, without status migrainosus: Secondary | ICD-10-CM | POA: Insufficient documentation

## 2013-04-13 MED ORDER — HYDROMORPHONE HCL PF 2 MG/ML IJ SOLN
2.0000 mg | Freq: Once | INTRAMUSCULAR | Status: AC
  Administered 2013-04-13: 2 mg via INTRAMUSCULAR
  Filled 2013-04-13: qty 1

## 2013-04-13 MED ORDER — LORAZEPAM 2 MG/ML IJ SOLN
1.0000 mg | Freq: Once | INTRAMUSCULAR | Status: AC
  Administered 2013-04-13: 1 mg via INTRAMUSCULAR
  Filled 2013-04-13: qty 1

## 2013-04-13 NOTE — ED Notes (Addendum)
Pt states she tripped over a rug tonight causing increase pain to her lower back. At the current pt is unable to move herself from wheelchair to bed. Pt does have sensation in both lower ext & limited movement. Pt advised EDP she was under a pain contract, EDP advised if her pain dr had questions he could call the ER.

## 2013-04-13 NOTE — ED Notes (Signed)
Patient is very anxious and tearful at this time. Uses slide board to transfer from wheelchair to bed. Very apologetic about her behavior. States that she is in a lot of pain.

## 2013-04-13 NOTE — ED Notes (Signed)
Pt alert & oriented x4. Patient given discharge instructions, paperwork & prescription(s). Patient verbalized understanding, very concerned about pain contract, note added about pt notifying EDP about her contract.. Pt left department w/ no further questions.

## 2013-04-13 NOTE — ED Notes (Addendum)
Pt reports falling about 6 pm this evening, and has had increased pain and decreased ROM since that time.  Pt reports pain in lumbar area, does have chronic pain, but states this pain is different.  Pt reports that she does have a spine stimulator in place, and is having some increased numbness.

## 2013-04-13 NOTE — ED Provider Notes (Signed)
CSN: [id number]     Arrival date & time 04/13/13  1906 History  This chart was scribed for [name], MD by Bennett Scrape, ED Scribe. This patient was seen in room APA11/APA11 and the patient's care was started at 7:47 PM.   Chief Complaint  Patient presents with  . Back Pain  . Fall    Patient is a 57 y.o. female presenting with back pain. The history is provided by the patient. No language interpreter was used.  Back Pain Location:  Lumbar spine Pain severity:  Severe Pain is:  Same all the time Onset quality:  Sudden Duration:  2 hours Timing:  Constant Progression:  Worsening Chronicity:  Chronic Context: falling   Associated symptoms: numbness   Associated symptoms: no abdominal pain, no chest pain and no headaches     HPI Comments: [name] is a 57 y.o. female who presents to the Emergency Department complaining of worsening of chronic lower back pain after a trip and fall that occurred 2 hours ago. She reports that she fell forward landing out her outstretched hands with her back bowing inward upon impact. She has a h/o chronic back pain with decreased sensation in her posterior bilateral thighs and has a pain stimulator in place that she says in up "full blast" since the incident. She reports associated decreased ROM and increased numbness since the fall. She denies any nausea and bowel or bladder incontinence.   PCP is Dr. Raenette Rover at [facility]. She has a chronic pain management and is currently on a pain contract.    Past Medical History  Diagnosis Date  . 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 Degeneration of lumbar or lumbosacral intervertebral disc   . Dermatophytosis of the body   . Chronic pain syndrome   . Edema   . Insomnia, unspecified   . Unspecified hypertrophic and atrophic condition of skin   . Cerebral aneurysm, nonruptured   . Unspecified hereditary and idiopathic peripheral neuropathy   . Tobacco use disorder    . Unspecified urinary incontinence   . Migraine, unspecified, without mention of intractable migraine without mention of status migrainosus   . Urinary tract infection, site not specified   . Carpal tunnel syndrome   . Unspecified sleep apnea     NPSG 01-09-09 AHI 6.2, RDI 13  . Restless legs syndrome (RLS)   . Myalgia and myositis, unspecified   . Arthropathy, unspecified, site unspecified   . Peptic ulcer, unspecified site, unspecified as acute or chronic, without mention of hemorrhage, perforation, or obstruction   . Bronchitis, not specified as acute or chronic   . Osteoarthrosis, unspecified whether generalized or localized, unspecified site   . Unspecified essential hypertension   . Other and unspecified hyperlipidemia   . Esophageal reflux   . Type II or unspecified type diabetes mellitus without mention of complication, not stated as uncontrolled   . Depressive disorder, not elsewhere classified   . Unspecified asthma(493.90)   . Anxiety state, unspecified   . Allergic rhinitis, cause unspecified     Skin Test POS 06-26-09  . Obesity   . Cirrhosis   . Colon polyp   . Anemia    Past Surgical History  Procedure Laterality Date  . Bladder lift  1993  . Cerebreal anuerysm  2008  . Tonsillectomy  age 74  . Laproscopic surgery abdomen for intestine infection  1984  . Carpal tunnel release  1996 2009    x  2 bilateral  . Partial hysterectomy  age 89    unlcear why this was done  . Colonoscopy    . Total knee arthroplasty      right  . Clavicle surgery      fracture   Family History  Problem Relation Age of Onset  . Alzheimer's disease Father     CVA  . Diabetes Father   . Uterine cancer Mother   . Lung cancer Mother   . COPD Mother   . Heart disease Mother   . Colon polyps Mother   . Diabetes Mother   . Irritable bowel syndrome Mother     maternal aunts x 2  . Schizophrenia Mother   . Heart disease Maternal Aunt     x 3 aunt  . Ovarian cancer Maternal Aunt    . Breast cancer Maternal Aunt   . Anxiety disorder Cousin   . Depression Cousin    History  Substance Use Topics  . Smoking status: Current Every Day Smoker -- 0.50 packs/day    Types: Cigarettes  . Smokeless tobacco: Never Used     Comment: 4-5 cig perday  . Alcohol Use: No     Comment: rarely- glass on wine   No OB history provided.   Review of Systems  Constitutional: Negative for appetite change and fatigue.  HENT: Negative for congestion, ear discharge and sinus pressure.   Eyes: Negative for discharge.  Respiratory: Negative for cough.   Cardiovascular: Negative for chest pain.  Gastrointestinal: Negative for abdominal pain and diarrhea.  Genitourinary: Negative for frequency and hematuria.  Musculoskeletal: Positive for back pain.  Skin: Negative for rash.  Neurological: Positive for numbness. Negative for seizures and headaches.  Psychiatric/Behavioral: Negative for hallucinations.    Allergies  Bupropion hcl; Erythromycin; Haloperidol lactate; Latex; Neomycin-bacitracin zn-polymyx; and Tape  Home Medications   Current Outpatient Rx  Name  Route  Sig  Dispense  Refill  . albuterol (PROAIR HFA) 108 (90 BASE) MCG/ACT inhaler   Inhalation   Inhale 2 puffs into the lungs every 4 (four) hours as needed. For shortness of breath         . ALPRAZolam (XANAX) 0.5 MG tablet   Oral   Take 1 tablet (0.5 mg total) by mouth 3 (three) times daily as needed for sleep or anxiety.   90 tablet   2   . Calcium Carbonate-Vitamin D (CALCIUM 600+D PO)   Oral   Take 3 tablets by mouth daily.         . cephALEXin (KEFLEX) 500 MG capsule   Oral   Take 1 capsule (500 mg total) by mouth 4 (four) times daily.   28 capsule   0   . dihydroergotamine (MIGRANAL) 4 MG/ML nasal spray   Nasal   Place 1 spray into the nose as needed. Use in one nostril as directed.  No more than 4 sprays in one hour for migraines.         . DULoxetine (CYMBALTA) 30 MG capsule   Oral   Take 1  capsule (30 mg total) by mouth daily.   30 capsule   2   . DULoxetine (CYMBALTA) 60 MG capsule   Oral   Take 1 capsule (60 mg total) by mouth daily.   30 capsule   2   . esomeprazole (NEXIUM) 40 MG capsule   Oral   Take 1 capsule (40 mg total) by mouth daily before breakfast.   20 capsule   0  Samples given to patient    Lot#  [id number]          ...   . ferrous sulfate 325 (65 FE) MG tablet   Oral   Take 325 mg by mouth daily with breakfast.         . gabapentin (NEURONTIN) 400 MG capsule   Oral   Take 400 mg by mouth 3 (three) times daily.          . meclizine (ANTIVERT) 25 MG tablet   Oral   Take 25 mg by mouth 3 (three) times daily as needed. For dizziness.         . Melatonin 5 MG TABS   Oral   Take 1 tablet by mouth at bedtime as needed. For sleep.         . Multiple Vitamin (MULTIVITAMIN WITH MINERALS) TABS   Oral   Take 1 tablet by mouth daily.         . nitroGLYCERIN (NITROSTAT) 0.4 MG SL tablet   Sublingual   Place 1 tablet (0.4 mg total) under the tongue every 5 (five) minutes as needed for chest pain.   30 tablet   0   . NUCYNTA ER 50 MG TB12   Oral   Take 1 tablet by mouth 2 (two) times daily. Takes as directed         . omeprazole (PRILOSEC) 40 MG capsule   Oral   Take 1 capsule (40 mg total) by mouth daily.   30 capsule   11   . ondansetron (ZOFRAN) 4 MG tablet   Oral   Take 4 mg by mouth every 8 (eight) hours as needed for nausea.          . phenazopyridine (PYRIDIUM) 200 MG tablet   Oral   Take 1 tablet (200 mg total) by mouth once.   6 tablet   0   . PRESCRIPTION MEDICATION   Subcutaneous   Inject 1 Syringe into the skin daily as needed. For migraines.  DHE 45         . ropinirole (REQUIP) 5 MG tablet   Oral   Take 5 mg by mouth at bedtime. For RLS.         Marland Kitchen tiZANidine (ZANAFLEX) 4 MG tablet   Oral   Take 4 mg by mouth every 8 (eight) hours as needed (muscle spasms). Every 6 hours as needed         .  topiramate (TOPAMAX) 200 MG tablet   Oral   Take 400 mg by mouth at bedtime.          Marland Kitchen venlafaxine XR (EFFEXOR-XR) 75 MG 24 hr capsule   Oral   Take 1 capsule (75 mg total) by mouth 2 (two) times daily.   60 capsule   2    Triage Vitals: BP 164/93  Pulse 80  Temp(Src) 98.3 F (36.8 C) (Oral)  Resp 22  Ht 5\' 5"  (1.651 m)  Wt 203 lb (92.08 kg)  BMI 33.78 kg/m2  SpO2 100%  Physical Exam  Nursing note and vitals reviewed. Constitutional: She is oriented to person, place, and time. She appears well-developed and well-nourished.  HENT:  Head: Normocephalic and atraumatic.  Eyes: Conjunctivae, EOM and lids are normal. No scleral icterus.  Neck: Neck supple. No thyromegaly present.  Cardiovascular: Normal rate and regular rhythm.  Exam reveals no gallop and no friction rub.   No murmur heard. Pulmonary/Chest: Effort normal and breath sounds normal. No  stridor. She has no wheezes. She has no rales. She exhibits no tenderness.  Abdominal: She exhibits no distension. There is no tenderness. There is no rebound.  Musculoskeletal: Normal range of motion. She exhibits no edema.  Tender in lumbar spine moderately  Lymphadenopathy:    She has no cervical adenopathy.  Neurological: She is alert and oriented to person, place, and time. She exhibits normal muscle tone. Coordination normal.  Weakness in bilateral lower extremities.  Skin: No rash noted. No erythema.  Psychiatric: She has a normal mood and affect. Her behavior is normal.    ED Course  Procedures (including critical care time)  Medications  HYDROmorphone (DILAUDID) injection 2 mg (not administered)  LORazepam (ATIVAN) injection 1 mg (not administered)    DIAGNOSTIC STUDIES: Oxygen Saturation is 100% on room air, normal by my interpretation.    COORDINATION OF CARE: 7:46 PM-Discussed treatment plan which includes medications and  with pt at bedside and pt agreed to plan.   9:08 PM-Pt rechecked and is sleeping  comfortably. Informed pt of radiology results. Discussed discharge plan which includes f/u with pain management with pt and pt agreed to plan. Also advised pt to follow up as needed and pt agreed. Addressed symptoms to return for with pt.   Labs Review Labs Reviewed - No data to display Imaging Review Dg Lumbar Spine Complete  04/13/2013   CLINICAL DATA:  57 year old female with low back pain after a fall. History of neural stimulator. Initial encounter.  EXAM: LUMBAR SPINE - COMPLETE 4+ VIEW  COMPARISON:  01/29/2013 and earlier.  FINDINGS: Normal lumbar segmentation. Bone mineralization is within normal limits. Stable lumbar and visualized lower thoracic vertebral height and alignment. Trace anterolisthesis at L4 on L5 again noted. Stable disc spaces. Left-sided neurostimulator device appears stable. sacral ala and SI joints appear stable and within normal limits. No pars fracture identified. Visible pelvis appears stable and intact.  IMPRESSION: No acute fracture or listhesis identified in the lumbar spine.   Electronically Signed   By: Augusto Gamble M.D.   On: 04/13/2013 20:44    EKG Interpretation   None       MDM  No diagnosis found. The chart was scribed for me under my direct supervision.  I personally performed the history, physical, and medical decision making and all procedures in the evaluation of this patient.[name], MD 04/13/13 [phone]

## 2013-05-12 ENCOUNTER — Telehealth (HOSPITAL_COMMUNITY): Payer: Self-pay

## 2013-05-12 NOTE — Telephone Encounter (Signed)
Left message

## 2013-05-13 ENCOUNTER — Telehealth (HOSPITAL_COMMUNITY): Payer: Self-pay

## 2013-05-13 NOTE — Telephone Encounter (Signed)
Needs to take some extra xanax due to stress

## 2013-05-19 ENCOUNTER — Encounter (HOSPITAL_COMMUNITY): Payer: Self-pay | Admitting: Psychology

## 2013-05-19 ENCOUNTER — Ambulatory Visit (INDEPENDENT_AMBULATORY_CARE_PROVIDER_SITE_OTHER): Payer: Self-pay | Admitting: Psychology

## 2013-05-19 DIAGNOSIS — F333 Major depressive disorder, recurrent, severe with psychotic symptoms: Secondary | ICD-10-CM
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
## 2013-06-07 ENCOUNTER — Ambulatory Visit (INDEPENDENT_AMBULATORY_CARE_PROVIDER_SITE_OTHER): Payer: Medicare Other | Admitting: Psychiatry

## 2013-06-07 ENCOUNTER — Encounter (HOSPITAL_COMMUNITY): Payer: Self-pay | Admitting: Psychiatry

## 2013-06-07 VITALS — BP 130/80 | Ht 65.0 in | Wt 198.0 lb

## 2013-06-07 DIAGNOSIS — F332 Major depressive disorder, recurrent severe without psychotic features: Secondary | ICD-10-CM

## 2013-06-07 DIAGNOSIS — F4310 Post-traumatic stress disorder, unspecified: Secondary | ICD-10-CM

## 2013-06-07 DIAGNOSIS — F333 Major depressive disorder, recurrent, severe with psychotic symptoms: Secondary | ICD-10-CM

## 2013-06-07 MED ORDER — DULOXETINE HCL 30 MG PO CPEP: 30.0000 mg | ORAL_CAPSULE | Freq: Every day | ORAL | Status: DC

## 2013-06-07 MED ORDER — ALPRAZOLAM 0.5 MG PO TABS: 0.5000 mg | ORAL_TABLET | Freq: Three times a day (TID) | ORAL | Status: DC | PRN

## 2013-06-07 MED ORDER — DULOXETINE HCL 60 MG PO CPEP: 60.0000 mg | ORAL_CAPSULE | Freq: Every day | ORAL | Status: DC

## 2013-06-07 MED ORDER — VENLAFAXINE HCL ER 75 MG PO CP24: 75.0000 mg | ORAL_CAPSULE | Freq: Two times a day (BID) | ORAL | Status: DC
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
## 2013-06-17 ENCOUNTER — Ambulatory Visit (HOSPITAL_COMMUNITY): Payer: Self-pay | Admitting: Psychology

## 2013-06-24 ENCOUNTER — Ambulatory Visit (HOSPITAL_COMMUNITY): Payer: Self-pay | Admitting: Psychology

## 2013-07-06 ENCOUNTER — Encounter (HOSPITAL_COMMUNITY): Payer: Self-pay | Admitting: Psychology

## 2013-07-06 ENCOUNTER — Ambulatory Visit (INDEPENDENT_AMBULATORY_CARE_PROVIDER_SITE_OTHER): Payer: Medicare PPO | Admitting: Psychology

## 2013-07-06 DIAGNOSIS — F333 Major depressive disorder, recurrent, severe with psychotic symptoms: Secondary | ICD-10-CM
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
## 2013-07-12 ENCOUNTER — Ambulatory Visit: Payer: Self-pay | Admitting: Orthopedic Surgery

## 2013-07-12 LAB — URINALYSIS, COMPLETE
BLOOD: NEGATIVE
Bilirubin,UR: NEGATIVE
Glucose,UR: NEGATIVE mg/dL (ref 0–75)
KETONE: NEGATIVE
Leukocyte Esterase: NEGATIVE
Nitrite: NEGATIVE
Ph: 6 (ref 4.5–8.0)
Protein: NEGATIVE
RBC,UR: 1 /HPF (ref 0–5)
Specific Gravity: 1.01 (ref 1.003–1.030)
WBC UR: <1 /HPF (ref 0–5)

## 2013-07-12 LAB — BASIC METABOLIC PANEL
Anion Gap: 0 — ABNORMAL LOW (ref 7–16)
BUN: 12 mg/dL (ref 7–18)
CO2: 29 mmol/L (ref 21–32)
Calcium, Total: 9.4 mg/dL (ref 8.5–10.1)
Chloride: 110 mmol/L — ABNORMAL HIGH (ref 98–107)
Creatinine: 0.87 mg/dL (ref 0.60–1.30)
Glucose: 103 mg/dL — ABNORMAL HIGH (ref 65–99)
OSMOLALITY: 278 (ref 275–301)
Potassium: 3.6 mmol/L (ref 3.5–5.1)
Sodium: 139 mmol/L (ref 136–145)

## 2013-07-12 LAB — CBC
HCT: 37.7 % (ref 35.0–47.0)
HGB: 12.8 g/dL (ref 12.0–16.0)
MCH: 31.3 pg (ref 26.0–34.0)
MCHC: 33.9 g/dL (ref 32.0–36.0)
MCV: 92 fL (ref 80–100)
PLATELETS: 82 10*3/uL — AB (ref 150–440)
RBC: 4.08 10*6/uL (ref 3.80–5.20)
RDW: 13.1 % (ref 11.5–14.5)
WBC: 5.5 10*3/uL (ref 3.6–11.0)

## 2013-07-12 LAB — MRSA PCR SCREENING

## 2013-07-12 LAB — PROTIME-INR
INR: 1
PROTHROMBIN TIME: 13.1 s (ref 11.5–14.7)

## 2013-07-12 LAB — APTT: ACTIVATED PTT: 35.3 s (ref 23.6–35.9)

## 2013-07-15 ENCOUNTER — Encounter (HOSPITAL_COMMUNITY): Payer: Self-pay | Admitting: Emergency Medicine

## 2013-07-15 ENCOUNTER — Emergency Department (HOSPITAL_COMMUNITY)
Admission: EM | Admit: 2013-07-15 | Discharge: 2013-07-15 | Disposition: A | Discharge status: Home / Self Care | Payer: Medicare PPO | Attending: Emergency Medicine | Admitting: Emergency Medicine

## 2013-07-15 ENCOUNTER — Emergency Department (HOSPITAL_COMMUNITY): Payer: Medicare PPO

## 2013-07-15 DIAGNOSIS — Z860100 Personal history of colon polyps, unspecified: Secondary | ICD-10-CM | POA: Insufficient documentation

## 2013-07-15 DIAGNOSIS — Z872 Personal history of diseases of the skin and subcutaneous tissue: Secondary | ICD-10-CM | POA: Insufficient documentation

## 2013-07-15 DIAGNOSIS — F3289 Other specified depressive episodes: Secondary | ICD-10-CM | POA: Insufficient documentation

## 2013-07-15 DIAGNOSIS — G609 Hereditary and idiopathic neuropathy, unspecified: Secondary | ICD-10-CM | POA: Insufficient documentation

## 2013-07-15 DIAGNOSIS — W1809XA Striking against other object with subsequent fall, initial encounter: Secondary | ICD-10-CM | POA: Insufficient documentation

## 2013-07-15 DIAGNOSIS — K219 Gastro-esophageal reflux disease without esophagitis: Secondary | ICD-10-CM | POA: Insufficient documentation

## 2013-07-15 DIAGNOSIS — Z91040 Latex allergy status: Secondary | ICD-10-CM | POA: Insufficient documentation

## 2013-07-15 DIAGNOSIS — J45909 Unspecified asthma, uncomplicated: Secondary | ICD-10-CM | POA: Insufficient documentation

## 2013-07-15 DIAGNOSIS — F329 Major depressive disorder, single episode, unspecified: Secondary | ICD-10-CM | POA: Insufficient documentation

## 2013-07-15 DIAGNOSIS — D649 Anemia, unspecified: Secondary | ICD-10-CM | POA: Insufficient documentation

## 2013-07-15 DIAGNOSIS — Y929 Unspecified place or not applicable: Secondary | ICD-10-CM | POA: Insufficient documentation

## 2013-07-15 DIAGNOSIS — Z79899 Other long term (current) drug therapy: Secondary | ICD-10-CM | POA: Insufficient documentation

## 2013-07-15 DIAGNOSIS — F411 Generalized anxiety disorder: Secondary | ICD-10-CM | POA: Insufficient documentation

## 2013-07-15 DIAGNOSIS — Y939 Activity, unspecified: Secondary | ICD-10-CM | POA: Insufficient documentation

## 2013-07-15 DIAGNOSIS — Z8739 Personal history of other diseases of the musculoskeletal system and connective tissue: Secondary | ICD-10-CM | POA: Insufficient documentation

## 2013-07-15 DIAGNOSIS — E669 Obesity, unspecified: Secondary | ICD-10-CM | POA: Insufficient documentation

## 2013-07-15 DIAGNOSIS — G43909 Migraine, unspecified, not intractable, without status migrainosus: Secondary | ICD-10-CM | POA: Insufficient documentation

## 2013-07-15 DIAGNOSIS — I10 Essential (primary) hypertension: Secondary | ICD-10-CM | POA: Insufficient documentation

## 2013-07-15 DIAGNOSIS — G2581 Restless legs syndrome: Secondary | ICD-10-CM | POA: Insufficient documentation

## 2013-07-15 DIAGNOSIS — Z87440 Personal history of urinary (tract) infections: Secondary | ICD-10-CM | POA: Insufficient documentation

## 2013-07-15 DIAGNOSIS — E119 Type 2 diabetes mellitus without complications: Secondary | ICD-10-CM | POA: Insufficient documentation

## 2013-07-15 DIAGNOSIS — F17200 Nicotine dependence, unspecified, uncomplicated: Secondary | ICD-10-CM | POA: Insufficient documentation

## 2013-07-15 DIAGNOSIS — S0990XA Unspecified injury of head, initial encounter: Secondary | ICD-10-CM | POA: Insufficient documentation

## 2013-07-15 DIAGNOSIS — Z791 Long term (current) use of non-steroidal anti-inflammatories (NSAID): Secondary | ICD-10-CM | POA: Insufficient documentation

## 2013-07-15 MED ORDER — TRAMADOL HCL 50 MG PO TABS
50.0000 mg | ORAL_TABLET | Freq: Once | ORAL | Status: AC
  Administered 2013-07-15: 50 mg via ORAL
  Filled 2013-07-15: qty 1

## 2013-07-15 NOTE — ED Notes (Signed)
Patient and daughter voiced concerns about exam prior to CT and complain that MD has not been back to room to talk to patient,  Daughter does states she was out of room for a while - patient does not recall MD coming to room.  Advised pt that MD Zammit had ordered her pain medication and her CT scans were negative.  He advised her to follow with her Primary MD next week if still having concerns.

## 2013-07-15 NOTE — Discharge Instructions (Signed)
Follow up with your md next week if any problems °

## 2013-07-15 NOTE — ED Notes (Addendum)
Pt reports that she fell this morning and hit her head on the right side.  States that there was some LOC, she believes about 45 minutes.  States that she has had increasing pain since that time.  Reporting generalized chronic pain, but did not take any pain medications.  Pt reports she did see pain management doctor last week and was prescribed fentanyl patches but has not been able to get them from pharmacy.

## 2013-07-15 NOTE — ED Notes (Addendum)
Patient and daughter expressed that they are pleased with the information provided by [facility].

## 2013-07-15 NOTE — ED Provider Notes (Signed)
CSN: [id number]     Arrival date & time 07/15/13  1928 History   First MD Initiated Contact with Patient 07/15/13 1940     Chief Complaint  Patient presents with  . Fall   (Consider location/radiation/quality/duration/timing/severity/associated sxs/prior Treatment) Patient is a 59 y.o. female presenting with fall. The history is provided by the patient (pt states she fell and hit her head.  possible loc).  Fall This is a new problem. The current episode started 2 days ago. The problem occurs rarely. The problem has been resolved. Associated symptoms include headaches. Pertinent negatives include no chest pain and no abdominal pain. Nothing aggravates the symptoms. Nothing relieves the symptoms.    Past Medical History  Diagnosis Date  . 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 Degeneration of lumbar or lumbosacral intervertebral disc   . Dermatophytosis of the body   . Chronic pain syndrome   . Edema   . Insomnia, unspecified   . Unspecified hypertrophic and atrophic condition of skin   . Cerebral aneurysm, nonruptured   . Unspecified hereditary and idiopathic peripheral neuropathy   . Tobacco use disorder   . Unspecified urinary incontinence   . Migraine, unspecified, without mention of intractable migraine without mention of status migrainosus   . Urinary tract infection, site not specified   . Carpal tunnel syndrome   . Unspecified sleep apnea     NPSG 01-09-09 AHI 6.2, RDI 13  . Restless legs syndrome (RLS)   . Myalgia and myositis, unspecified   . Arthropathy, unspecified, site unspecified   . Peptic ulcer, unspecified site, unspecified as acute or chronic, without mention of hemorrhage, perforation, or obstruction   . Bronchitis, not specified as acute or chronic   . Osteoarthrosis, unspecified whether generalized or localized, unspecified site   . Unspecified essential hypertension   . Other and unspecified hyperlipidemia   . Esophageal reflux   .  Type II or unspecified type diabetes mellitus without mention of complication, not stated as uncontrolled   . Depressive disorder, not elsewhere classified   . Unspecified asthma(493.90)   . Anxiety state, unspecified   . Allergic rhinitis, cause unspecified     Skin Test POS 06-26-09  . Obesity   . Cirrhosis   . Colon polyp   . Anemia    Past Surgical History  Procedure Laterality Date  . Bladder lift  1993  . Cerebreal anuerysm  2008  . Tonsillectomy  age 74  . Laproscopic surgery abdomen for intestine infection  1984  . Carpal tunnel release  1996 2009    x 2 bilateral  . Partial hysterectomy  age 65    unlcear why this was done  . Colonoscopy    . Total knee arthroplasty      right  . Clavicle surgery      fracture   Family History  Problem Relation Age of Onset  . Alzheimer's disease Father     CVA  . Diabetes Father   . Uterine cancer Mother   . Lung cancer Mother   . COPD Mother   . Heart disease Mother   . Colon polyps Mother   . Diabetes Mother   . Irritable bowel syndrome Mother     maternal aunts x 2  . Schizophrenia Mother   . Heart disease Maternal Aunt     x 3 aunt  . Ovarian cancer Maternal Aunt   . Breast cancer Maternal Aunt   . Anxiety disorder Cousin   .  Depression Cousin    History  Substance Use Topics  . Smoking status: Current Every Day Smoker -- 0.50 packs/day    Types: Cigarettes  . Smokeless tobacco: Never Used     Comment: 4-5 cig perday  . Alcohol Use: No     Comment: rarely- glass on wine   OB History   Grav Para Term Preterm Abortions TAB SAB Ect Mult Living                 Review of Systems  Constitutional: Negative for appetite change and fatigue.  HENT: Negative for congestion, ear discharge and sinus pressure.   Eyes: Negative for discharge.  Respiratory: Negative for cough.   Cardiovascular: Negative for chest pain.  Gastrointestinal: Negative for abdominal pain and diarrhea.  Genitourinary: Negative for frequency  and hematuria.  Musculoskeletal: Negative for back pain.  Skin: Negative for rash.  Neurological: Positive for headaches. Negative for seizures.  Psychiatric/Behavioral: Negative for hallucinations.    Allergies  Bupropion hcl; Erythromycin; Haloperidol lactate; Latex; Neomycin-bacitracin zn-polymyx; and Tape  Home Medications   Current Outpatient Rx  Name  Route  Sig  Dispense  Refill  . albuterol (PROAIR HFA) 108 (90 BASE) MCG/ACT inhaler   Inhalation   Inhale 2 puffs into the lungs every 4 (four) hours as needed. For shortness of breath         . ALPRAZolam (XANAX) 0.5 MG tablet   Oral   Take 1 tablet (0.5 mg total) by mouth 3 (three) times daily as needed for sleep or anxiety.   90 tablet   2   . Calcium Carbonate-Vitamin D (CALCIUM 600+D PO)   Oral   Take 1 tablet by mouth 2 (two) times daily. Patient states that she takes a total of 1200mg  daily         . dihydroergotamine (MIGRANAL) 4 MG/ML nasal spray   Nasal   Place 1 spray into the nose as needed. Use in one nostril as directed.  No more than 4 sprays in one hour for migraines.         . DULoxetine (CYMBALTA) 30 MG capsule   Oral   Take 1 capsule (30 mg total) by mouth daily.   30 capsule   2   . DULoxetine (CYMBALTA) 60 MG capsule   Oral   Take 1 capsule (60 mg total) by mouth daily.   30 capsule   2   . ferrous sulfate 325 (65 FE) MG tablet   Oral   Take 325 mg by mouth daily with breakfast.         . furosemide (LASIX) 20 MG tablet   Oral   Take 20 mg by mouth daily. For fluid retention         . gabapentin (NEURONTIN) 400 MG capsule   Oral   Take 400 mg by mouth 3 (three) times daily.          . meclizine (ANTIVERT) 25 MG tablet   Oral   Take 25 mg by mouth 3 (three) times daily as needed. For dizziness.         . Melatonin 5 MG TABS   Oral   Take 2 tablets by mouth at bedtime as needed (10mg  total daily at bedtime as needed for sleep). For sleep.         . Multiple Vitamin  (MULTIVITAMIN WITH MINERALS) TABS   Oral   Take 1 tablet by mouth daily.         Marland Kitchen  naproxen sodium (ALEVE) 220 MG tablet   Oral   Take 220 mg by mouth daily as needed (for headache pain).         . nitroGLYCERIN (NITROSTAT) 0.4 MG SL tablet   Sublingual   Place 1 tablet (0.4 mg total) under the tongue every 5 (five) minutes as needed for chest pain.   30 tablet   0   . omeprazole (PRILOSEC) 40 MG capsule   Oral   Take 1 capsule (40 mg total) by mouth daily.   30 capsule   11   . Potassium 99 MG TABS   Oral   Take 1 tablet by mouth daily.         Marland Kitchen PRESCRIPTION MEDICATION   Subcutaneous   Inject 1 Syringe into the skin daily as needed. For migraines.  DHE 45         . ropinirole (REQUIP) 5 MG tablet   Oral   Take 5 mg by mouth at bedtime. For RLS.         Marland Kitchen tapentadol (NUCYNTA) 50 MG TABS tablet   Oral   Take 100 mg by mouth 2 (two) times daily.         Marland Kitchen tiZANidine (ZANAFLEX) 4 MG tablet   Oral   Take 4 mg by mouth every 8 (eight) hours as needed (muscle spasms). Every 6 to 8 hours as needed for muscle spasms         . topiramate (TOPAMAX) 200 MG tablet   Oral   Take 200 mg by mouth at bedtime.          . traMADol (ULTRAM) 50 MG tablet   Oral   Take 50 mg by mouth 3 (three) times daily.         Marland Kitchen venlafaxine XR (EFFEXOR-XR) 75 MG 24 hr capsule   Oral   Take 1 capsule (75 mg total) by mouth 2 (two) times daily.   60 capsule   2    BP 171/71  Pulse 83  Temp(Src) 98.1 F (36.7 C) (Oral)  Resp 18  Ht 5\' 4"  (1.626 m)  Wt 187 lb (84.823 kg)  BMI 32.08 kg/m2  SpO2 97% Physical Exam  Constitutional: She is oriented to person, place, and time. She appears well-developed.  HENT:  Head: Normocephalic.  Tender mild right forehead  Eyes: Conjunctivae and EOM are normal. No scleral icterus.  Neck: Neck supple. No thyromegaly present.  Cardiovascular: Normal rate and regular rhythm.  Exam reveals no gallop and no friction rub.   No murmur  heard. Pulmonary/Chest: No stridor. She has no wheezes. She has no rales. She exhibits no tenderness.  Abdominal: She exhibits no distension. There is no tenderness. There is no rebound.  Musculoskeletal: Normal range of motion. She exhibits no edema.  Lymphadenopathy:    She has no cervical adenopathy.  Neurological: She is oriented to person, place, and time. She exhibits normal muscle tone. Coordination normal.  Skin: No rash noted. No erythema.  Psychiatric: She has a normal mood and affect. Her behavior is normal.    ED Course  Procedures (including critical care time) Labs Review Labs Reviewed - No data to display Imaging Review Ct Head Wo Contrast  07/15/2013   CLINICAL DATA:  Status post fall with a blow to the right side of the head.  EXAM: CT HEAD WITHOUT CONTRAST  CT CERVICAL SPINE WITHOUT CONTRAST  TECHNIQUE: Multidetector CT imaging of the head and cervical spine was performed following the standard  protocol without intravenous contrast. Multiplanar CT image reconstructions of the cervical spine were also generated.  COMPARISON:  MR cervical spine 07/02/2010.  Head CT scan 01/10/2006.  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 The patient is status post right frontal craniotomy. There is no evidence of acute intracranial abnormality including infarction, hemorrhage, mass lesion, mass effect, midline shift or abnormal extra-axial fluid collection. There is no hydrocephalus or pneumocephalus.  CT CERVICAL SPINE FINDINGS  No fracture or malalignment of the cervical spine is identified. No notable degenerative disease. Paraspinous structures are unremarkable. Lung apices are clear.  IMPRESSION: No acute finding head or cervical spine.   Electronically Signed   By: Inge Rise M.D.   On: 07/15/2013 20:50   Ct Cervical Spine Wo Contrast  07/15/2013   CLINICAL DATA:  Status post fall with a blow to the right side of the head.  EXAM: CT HEAD WITHOUT CONTRAST   CT CERVICAL SPINE WITHOUT CONTRAST  TECHNIQUE: Multidetector CT imaging of the head and cervical spine was performed following the standard protocol without intravenous contrast. Multiplanar CT image reconstructions of the cervical spine were also generated.  COMPARISON:  MR cervical spine 07/02/2010.  Head CT scan 01/10/2006.  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 The patient is status post right frontal craniotomy. There is no evidence of acute intracranial abnormality including infarction, hemorrhage, mass lesion, mass effect, midline shift or abnormal extra-axial fluid collection. There is no hydrocephalus or pneumocephalus.  CT CERVICAL SPINE FINDINGS  No fracture or malalignment of the cervical spine is identified. No notable degenerative disease. Paraspinous structures are unremarkable. Lung apices are clear.  IMPRESSION: No acute finding head or cervical spine.   Electronically Signed   By: Inge Rise M.D.   On: 07/15/2013 20:50    EKG Interpretation   None       MDM   1. Head injury        Maudry Diego, MD 07/15/13 2114

## 2013-07-15 NOTE — ED Notes (Signed)
Informed pt and daughter MD is not here now, and they requested to be seen by another MD prior to discharge.  Dr Betsey Holiday advised and will talk with patient.

## 2013-07-28 ENCOUNTER — Telehealth (HOSPITAL_COMMUNITY): Payer: Self-pay | Admitting: *Deleted

## 2013-07-28 NOTE — Telephone Encounter (Signed)
Refills at pharmacy.

## 2013-08-04 ENCOUNTER — Ambulatory Visit (HOSPITAL_COMMUNITY): Payer: Self-pay | Admitting: Psychology

## 2013-08-04 ENCOUNTER — Encounter (HOSPITAL_COMMUNITY): Payer: Self-pay | Admitting: Psychology

## 2013-08-06 ENCOUNTER — Telehealth (HOSPITAL_COMMUNITY): Payer: Self-pay | Admitting: *Deleted

## 2013-08-06 NOTE — Telephone Encounter (Signed)
All meds refilled.

## 2013-08-09 ENCOUNTER — Telehealth (HOSPITAL_COMMUNITY): Payer: Self-pay | Admitting: *Deleted

## 2013-08-09 ENCOUNTER — Ambulatory Visit (HOSPITAL_COMMUNITY): Payer: Self-pay | Admitting: Psychiatry

## 2013-08-09 ENCOUNTER — Encounter (HOSPITAL_COMMUNITY): Payer: Self-pay | Admitting: Psychiatry

## 2013-08-09 NOTE — Telephone Encounter (Signed)
scripts were filled last week, she is coming in tomorrow

## 2013-08-10 ENCOUNTER — Ambulatory Visit (HOSPITAL_COMMUNITY): Payer: Self-pay | Admitting: Psychiatry

## 2013-08-13 ENCOUNTER — Encounter (HOSPITAL_COMMUNITY): Payer: Self-pay | Admitting: Psychiatry

## 2013-08-13 ENCOUNTER — Ambulatory Visit (INDEPENDENT_AMBULATORY_CARE_PROVIDER_SITE_OTHER): Payer: Medicare PPO | Admitting: Psychiatry

## 2013-08-13 VITALS — BP 130/80 | Ht 64.0 in | Wt 193.0 lb

## 2013-08-13 DIAGNOSIS — F333 Major depressive disorder, recurrent, severe with psychotic symptoms: Secondary | ICD-10-CM

## 2013-08-13 DIAGNOSIS — F332 Major depressive disorder, recurrent severe without psychotic features: Secondary | ICD-10-CM

## 2013-08-13 DIAGNOSIS — F4310 Post-traumatic stress disorder, unspecified: Secondary | ICD-10-CM

## 2013-08-13 MED ORDER — VENLAFAXINE HCL ER 75 MG PO CP24: 75.0000 mg | ORAL_CAPSULE | Freq: Two times a day (BID) | ORAL | Status: DC

## 2013-08-13 MED ORDER — ALPRAZOLAM 0.5 MG PO TABS
0.5000 mg | ORAL_TABLET | Freq: Three times a day (TID) | ORAL | Status: DC | PRN
Start: 2013-08-13 — End: 2014-02-04

## 2013-08-13 MED ORDER — DULOXETINE HCL 60 MG PO CPEP: 60.0000 mg | ORAL_CAPSULE | Freq: Every day | ORAL | Status: DC

## 2013-08-13 MED ORDER — DULOXETINE HCL 30 MG PO CPEP: 30.0000 mg | ORAL_CAPSULE | Freq: Every day | ORAL | Status: DC
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
## 2013-08-14 LAB — URINALYSIS, COMPLETE
BILIRUBIN, UR: NEGATIVE
Blood: NEGATIVE
Glucose,UR: NEGATIVE mg/dL (ref 0–75)
Ketone: NEGATIVE
Leukocyte Esterase: NEGATIVE
NITRITE: NEGATIVE
PROTEIN: NEGATIVE
Ph: 7 (ref 4.5–8.0)
Specific Gravity: 1.015 (ref 1.003–1.030)
WBC UR: 1 /HPF (ref 0–5)

## 2013-08-14 LAB — COMPREHENSIVE METABOLIC PANEL
ALT: 34 U/L (ref 12–78)
ANION GAP: 5 — AB (ref 7–16)
Albumin: 3.8 g/dL (ref 3.4–5.0)
Alkaline Phosphatase: 96 U/L
BUN: 17 mg/dL (ref 7–18)
Bilirubin,Total: 0.5 mg/dL (ref 0.2–1.0)
CREATININE: 1.09 mg/dL (ref 0.60–1.30)
Calcium, Total: 8.7 mg/dL (ref 8.5–10.1)
Chloride: 112 mmol/L — ABNORMAL HIGH (ref 98–107)
Co2: 25 mmol/L (ref 21–32)
EGFR (African American): >60
EGFR (Non-African Amer.): 56 — ABNORMAL LOW
Glucose: 133 mg/dL — ABNORMAL HIGH (ref 65–99)
OSMOLALITY: 287 (ref 275–301)
Potassium: 3.6 mmol/L (ref 3.5–5.1)
SGOT(AST): 44 U/L — ABNORMAL HIGH (ref 15–37)
SODIUM: 142 mmol/L (ref 136–145)
Total Protein: 7.4 g/dL (ref 6.4–8.2)

## 2013-08-14 LAB — DRUG SCREEN, URINE
AMPHETAMINES, UR SCREEN: NEGATIVE (ref ?–1000)
BARBITURATES, UR SCREEN: NEGATIVE (ref ?–200)
Benzodiazepine, Ur Scrn: POSITIVE (ref ?–200)
Cannabinoid 50 Ng, Ur ~~LOC~~: NEGATIVE (ref ?–50)
Cocaine Metabolite,Ur ~~LOC~~: NEGATIVE (ref ?–300)
MDMA (ECSTASY) UR SCREEN: NEGATIVE (ref ?–500)
Methadone, Ur Screen: NEGATIVE (ref ?–300)
OPIATE, UR SCREEN: POSITIVE (ref ?–300)
Phencyclidine (PCP) Ur S: NEGATIVE (ref ?–25)
TRICYCLIC, UR SCREEN: NEGATIVE (ref ?–1000)

## 2013-08-14 LAB — CBC
HCT: 41.3 % (ref 35.0–47.0)
HGB: 13.7 g/dL (ref 12.0–16.0)
MCH: 30.4 pg (ref 26.0–34.0)
MCHC: 33.1 g/dL (ref 32.0–36.0)
MCV: 92 fL (ref 80–100)
Platelet: 97 10*3/uL — ABNORMAL LOW (ref 150–440)
RBC: 4.5 10*6/uL (ref 3.80–5.20)
RDW: 12.9 % (ref 11.5–14.5)
WBC: 6.5 10*3/uL (ref 3.6–11.0)

## 2013-08-14 LAB — ETHANOL: Ethanol %: <0.003 % (ref 0.000–0.080)

## 2013-08-14 LAB — ACETAMINOPHEN LEVEL: Acetaminophen: <2 ug/mL

## 2013-08-14 LAB — SALICYLATE LEVEL: Salicylates, Serum: <1.7 mg/dL

## 2013-08-16 ENCOUNTER — Inpatient Hospital Stay: Payer: Self-pay | Admitting: Psychiatry

## 2013-08-23 ENCOUNTER — Telehealth (HOSPITAL_COMMUNITY): Payer: Self-pay | Admitting: *Deleted

## 2013-08-23 NOTE — Telephone Encounter (Signed)
Wanted permission to take xanax --agree, but no more than 3 a day

## 2013-08-27 ENCOUNTER — Telehealth (HOSPITAL_COMMUNITY): Payer: Self-pay | Admitting: *Deleted

## 2013-08-31 NOTE — Telephone Encounter (Signed)
Left message--ok to start effexor xr

## 2013-09-01 ENCOUNTER — Ambulatory Visit (HOSPITAL_COMMUNITY): Payer: Self-pay | Admitting: Psychology

## 2013-09-01 ENCOUNTER — Telehealth (HOSPITAL_COMMUNITY): Payer: Self-pay | Admitting: *Deleted

## 2013-09-01 NOTE — Telephone Encounter (Signed)
ok 

## 2013-09-02 ENCOUNTER — Ambulatory Visit (HOSPITAL_COMMUNITY): Payer: Self-pay | Admitting: Psychiatry

## 2013-09-16 ENCOUNTER — Encounter: Payer: Self-pay | Admitting: *Deleted

## 2013-09-16 ENCOUNTER — Encounter: Payer: Self-pay | Admitting: Internal Medicine

## 2013-09-16 DIAGNOSIS — M47816 Spondylosis without myelopathy or radiculopathy, lumbar region: Secondary | ICD-10-CM | POA: Insufficient documentation

## 2013-09-16 DIAGNOSIS — M545 Low back pain, unspecified: Secondary | ICD-10-CM | POA: Insufficient documentation

## 2013-09-16 DIAGNOSIS — M5136 Other intervertebral disc degeneration, lumbar region: Secondary | ICD-10-CM | POA: Insufficient documentation

## 2013-09-21 ENCOUNTER — Ambulatory Visit: Payer: Self-pay | Admitting: Neurology

## 2013-09-29 ENCOUNTER — Encounter (HOSPITAL_COMMUNITY): Payer: Self-pay | Admitting: Psychology

## 2013-09-29 ENCOUNTER — Ambulatory Visit (INDEPENDENT_AMBULATORY_CARE_PROVIDER_SITE_OTHER): Payer: Medicare PPO | Admitting: Psychology

## 2013-09-29 DIAGNOSIS — F333 Major depressive disorder, recurrent, severe with psychotic symptoms: Secondary | ICD-10-CM
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
## 2013-10-06 ENCOUNTER — Ambulatory Visit (HOSPITAL_COMMUNITY): Payer: Self-pay | Admitting: Psychiatry

## 2013-10-10 IMAGING — CR DG CHEST 1V PORT
1 series · 1 of 1 positions shown · non-contrast
Comparison: Previous examinations.

CLINICAL DATA: Chest and bilateral arm pain.  Smoker.

PORTABLE CHEST - 1 VIEW

[view not recorded]
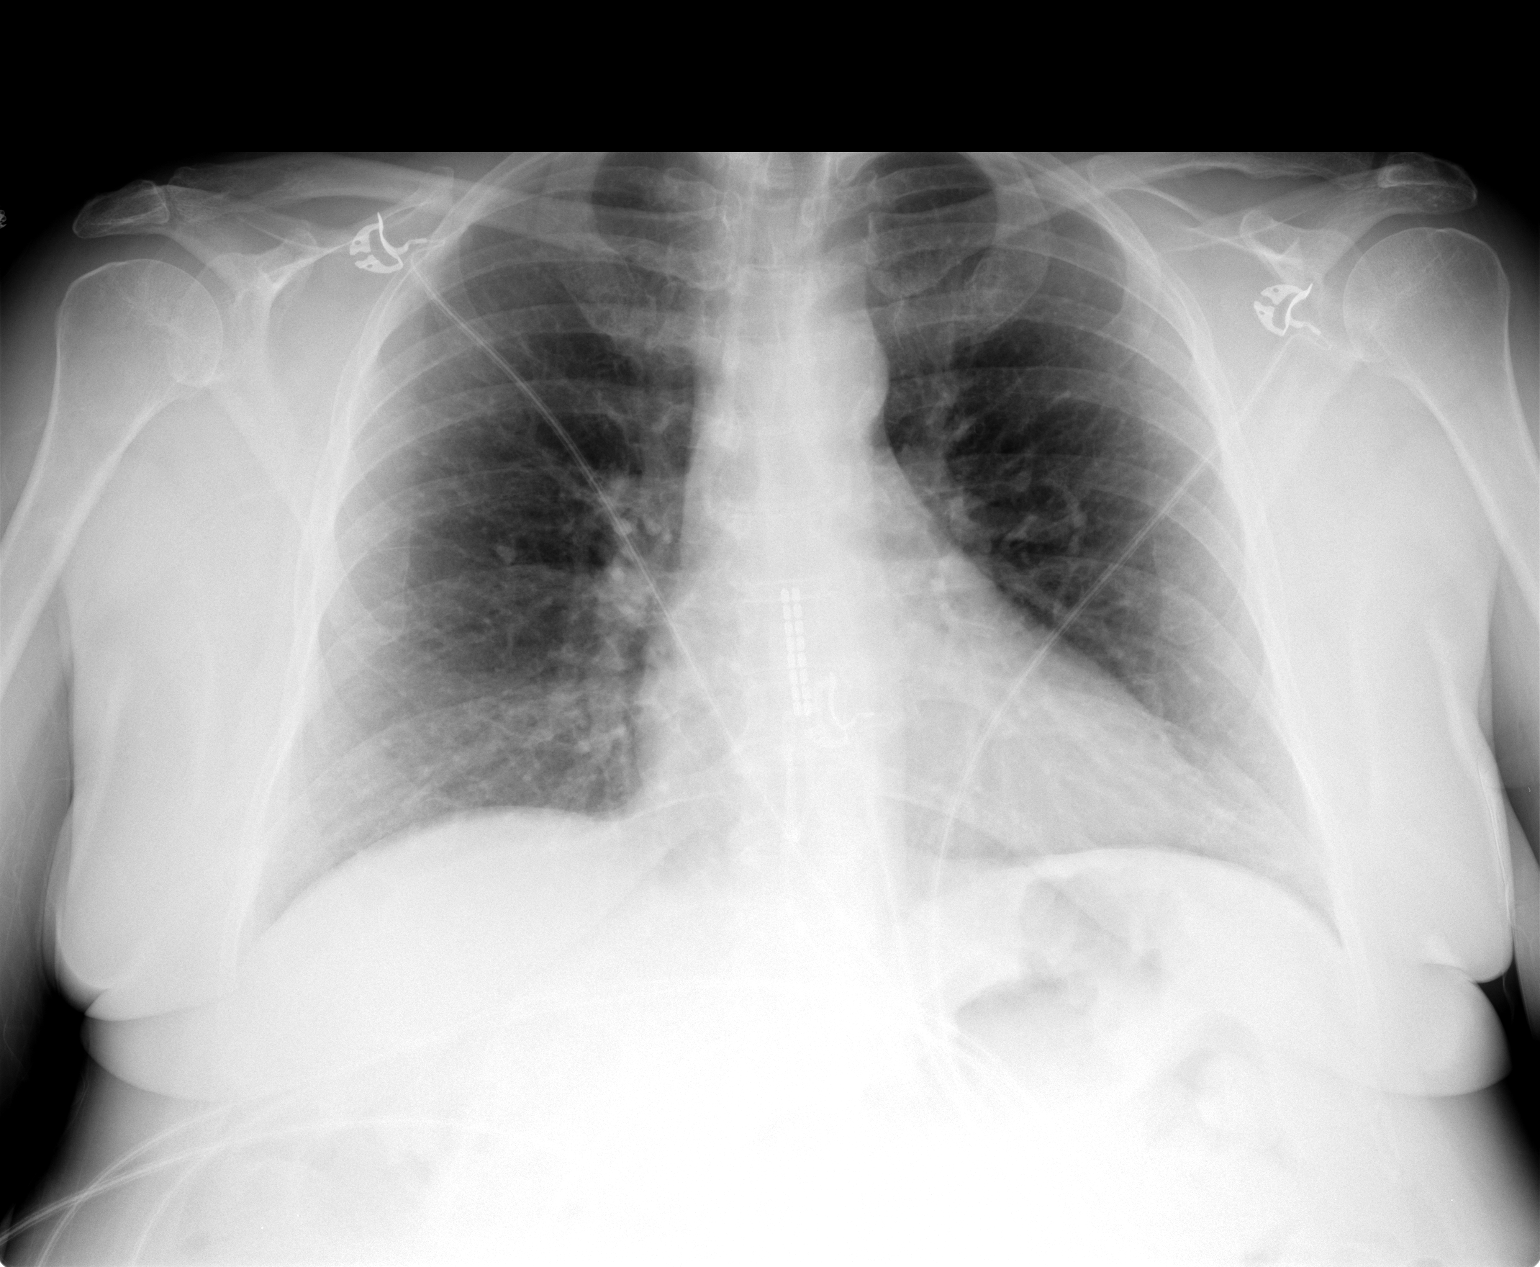

[1 of 1 positions shown; findings below may reference images not displayed]

FINDINGS: Stable normal sized heart and clear lungs with mildly
prominent interstitial markings.  Stable small calcified granuloma
in the right mid lung zone.  Neural stimulator leads overlying the
lower thoracic spine.
IMPRESSION: 1.  No acute abnormality.
2.  Stable mild chronic interstitial lung disease compatible with
the history of smoking.

## 2013-10-20 ENCOUNTER — Encounter: Payer: Self-pay | Admitting: Neurology

## 2013-10-20 ENCOUNTER — Encounter (INDEPENDENT_AMBULATORY_CARE_PROVIDER_SITE_OTHER): Payer: Self-pay

## 2013-10-20 ENCOUNTER — Telehealth: Payer: Self-pay | Admitting: Neurology

## 2013-10-20 ENCOUNTER — Ambulatory Visit (HOSPITAL_COMMUNITY): Payer: Self-pay | Admitting: Psychiatry

## 2013-10-20 ENCOUNTER — Ambulatory Visit (INDEPENDENT_AMBULATORY_CARE_PROVIDER_SITE_OTHER): Payer: Commercial Managed Care - HMO | Admitting: Neurology

## 2013-10-20 VITALS — BP 114/71 | HR 69 | Resp 19 | Ht 63.25 in | Wt 187.0 lb

## 2013-10-20 DIAGNOSIS — F17200 Nicotine dependence, unspecified, uncomplicated: Secondary | ICD-10-CM

## 2013-10-20 DIAGNOSIS — G2571 Drug induced akathisia: Secondary | ICD-10-CM | POA: Insufficient documentation

## 2013-10-20 DIAGNOSIS — F29 Unspecified psychosis not due to a substance or known physiological condition: Secondary | ICD-10-CM

## 2013-10-20 DIAGNOSIS — T43505A Adverse effect of unspecified antipsychotics and neuroleptics, initial encounter: Secondary | ICD-10-CM

## 2013-10-20 DIAGNOSIS — G2589 Other specified extrapyramidal and movement disorders: Secondary | ICD-10-CM

## 2013-10-20 DIAGNOSIS — Z720 Tobacco use: Secondary | ICD-10-CM
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
## 2013-10-20 NOTE — Progress Notes (Signed)
During the entire visit, the patient was falling asleep and her words were very slurred.  I had to awaken her numerous times while trying to assess her in the room.  After she had seen the doctor, I went in the room to discharge her and she was sound asleep, I had to yell her name 4 times before she heard me and woke up.

## 2013-10-20 NOTE — Patient Instructions (Signed)
Psychosis Psychosis refers to a severe lack of understanding with reality. During a psychotic episode, you are not able to think clearly. During a psychotic episode, your responses and emotions are inappropriate and do not coincide with what is actually happening. You often have false beliefs about what is happening or who you are (delusions), and you may see, hear, taste, smell, or feel things that are not present (hallucinations). Psychosis is usually a severe symptom of a very serious mental health (psychiatric) condition, but it can sometimes be the result of a medical condition. CAUSES   Psychiatric conditions, such as:  Schizophrenia.  Bipolar disorder.  Depression.  Personality disorders.  Alcohol or drug abuse.  Medical conditions, such as:  Brain injury.  Brain tumor.  Dementia.  Brain diseases, such as Alzheimer's, Parkinson's, or Huntington's disease.  Neurological diseases, such as epilepsy.  Genetic disorders.  Metabolic disorders.  Infections that affect the brain.  Certain prescription drugs.  Stroke. SYMPTOMS   Unable to think or speak clearly or respond appropriately.  Disorganized thinking (thoughts jump from one thought to another).  Severe inappropriate behavior.  Delusions may include:  A strong belief that is odd, unrealistic, or false.  Feeling extremely fearful or suspicious (paranoid).  Believing you are someone else, have high importance, or have an altered identity.  Hallucinations. DIAGNOSIS   Mental health evaluation.  Physical exam.  Blood tests.  Computerized magnetic scan (MRI) or other brain scans. TREATMENT  Your caregiver will recommend a course of treatment that depends on the cause of the psychosis. Treatment may include:  Monitoring and supportive care in the hospital.  Taking medicines (antipsychotic medicine) to reduce symptoms and balance chemicals in the brain.  Taking medicines to manage underlying  mental health conditions.  Therapy and other supportive programs outside of the hospital.  Treating an underlying medical condition. If the cause of the psychosis can be treated or corrected, the outlook is good. Without treatment, psychotic episodes can cause danger to yourself or others. Treatment may be short-term or lifelong. HOME CARE INSTRUCTIONS   Take all medicines as directed. This is important.  Use a pillbox or write down your medicine schedule to make sure you are taking them.  Check with your caregiver before using over-the-counter medicines, herbs, or supplements.  Seek individual and family support through therapy and mental health education (psychoeducation) programs. These will help you manage symptoms and side effects of medicines, learn life skills, and maintain a healthy routine.  Maintain a healthy lifestyle.  Exercise regularly.  Avoid alcohol and drugs.  Learn ways to reduce stress and cope with stress, such as yoga and meditation.  Talk about your feelings with family members or caregivers.  Make time for yourself to do things you enjoy.  Know the early warning signs of psychosis. Your caregiver will recommend steps to take when you notice symptoms such as:  Feeling anxious or preoccupied.  Having racing thoughts.  Changes in your interest in life and relationships.  Follow up with your caregivers for continued outpatient treatment as directed. SEEK MEDICAL CARE IF:   Medicines do not seem to be helping.  You hear voices telling you to do things.  You see, smell, or feel things that are not there.  You feel hopeless and overwhelmed.  You feel extremely fearful and suspicious that something will harm you.  You feel like you cannot leave your house.  You have trouble taking care of yourself.  You experience side effects of medicines, such as   changes in sleep patterns, dizziness, weight gain, restlessness, movement changes, muscle spasms, or  tremors. °SEEK IMMEDIATE MEDICAL CARE IF:  °Severe psychotic symptoms present a safety issue (such as an urge to hurt yourself or others). °MAKE SURE YOU:  °· Understand these instructions. °· Will watch your condition. °· Will get help right away if you are not doing well or get worse. °FOR MORE INFORMATION  °National Institute of Mental Health: www.nimh.nih.gov °Document Released: 11/14/2009 Document Revised: 08/19/2011 Document Reviewed: 11/14/2009 °ExitCare® Patient Information ©2014 [employer]. ° °

## 2013-10-20 NOTE — Telephone Encounter (Signed)
This documentation is in reference to the patient's visit today.  I was asked by the doctors CMA to assist her in removing the patient from the exam room.  The doctor had completed her evaluation, but the patient refused to leave the room until the doctor came back in the room to speak to her.  When I entered the room the patient said she had slept through some of the exam and had to use the rest room, and didn't feel the doctor had answered her questions.  She was very agitated, cursing and refusing to leave, and threatening to take all her pills because she doesn't need to live anymore .  I tried to calm her, asked her if she was seeing a psychiatrist, which she acknowledged she did, and was to see a new one next week.   She continued with her outbursts when I explained that the doctor was not going to come back and speak to her, and that she needed to leave. She finally left yelling and cursing, I did follow her until she came to her car, where her husband was waiting.  I spent about 15 minutes just listening, she kept saying her sleep problem was neurological and not due to depression.  She started to  settled down, and I asked her husband to keep watch and take her to behavioral health if necessary.  He acknowledged that he understood.

## 2013-12-08 ENCOUNTER — Encounter: Payer: Self-pay | Admitting: Internal Medicine

## 2013-12-28 ENCOUNTER — Ambulatory Visit: Payer: Self-pay | Admitting: Orthopedic Surgery

## 2013-12-28 LAB — CBC
HCT: 41.6 % (ref 35.0–47.0)
HGB: 13.4 g/dL (ref 12.0–16.0)
MCH: 30.3 pg (ref 26.0–34.0)
MCHC: 32.1 g/dL (ref 32.0–36.0)
MCV: 94 fL (ref 80–100)
Platelet: 105 10*3/uL — ABNORMAL LOW (ref 150–440)
RBC: 4.41 10*6/uL (ref 3.80–5.20)
RDW: 13.8 % (ref 11.5–14.5)
WBC: 7.5 10*3/uL (ref 3.6–11.0)

## 2013-12-28 LAB — BASIC METABOLIC PANEL
Anion Gap: 7 (ref 7–16)
BUN: 15 mg/dL (ref 7–18)
CO2: 28 mmol/L (ref 21–32)
Calcium, Total: 9.1 mg/dL (ref 8.5–10.1)
Chloride: 107 mmol/L (ref 98–107)
Creatinine: 0.99 mg/dL (ref 0.60–1.30)
Glucose: 157 mg/dL — ABNORMAL HIGH (ref 65–99)
Osmolality: 287 (ref 275–301)
POTASSIUM: 3.1 mmol/L — AB (ref 3.5–5.1)
SODIUM: 142 mmol/L (ref 136–145)

## 2013-12-28 LAB — URINALYSIS, COMPLETE
Bacteria: NONE SEEN
Glucose,UR: NEGATIVE mg/dL (ref 0–75)
KETONE: NEGATIVE
LEUKOCYTE ESTERASE: NEGATIVE
Nitrite: NEGATIVE
Ph: 7 (ref 4.5–8.0)
Protein: NEGATIVE
RBC,UR: <1 /HPF (ref 0–5)
Specific Gravity: 1.005 (ref 1.003–1.030)
WBC UR: <1 /HPF (ref 0–5)

## 2013-12-28 LAB — PROTIME-INR
INR: 1
Prothrombin Time: 13.3 secs (ref 11.5–14.7)

## 2013-12-28 LAB — APTT: ACTIVATED PTT: 33.8 s (ref 23.6–35.9)

## 2013-12-28 LAB — MRSA PCR SCREENING

## 2014-01-14 ENCOUNTER — Emergency Department: Payer: Self-pay | Admitting: Emergency Medicine

## 2014-02-04 ENCOUNTER — Encounter (HOSPITAL_COMMUNITY): Payer: Self-pay | Admitting: Emergency Medicine

## 2014-02-04 ENCOUNTER — Emergency Department (HOSPITAL_COMMUNITY)
Admission: EM | Admit: 2014-02-04 | Discharge: 2014-02-04 | Disposition: A | Discharge status: Home / Self Care | Payer: Medicare PPO | Attending: Emergency Medicine | Admitting: Emergency Medicine

## 2014-02-04 DIAGNOSIS — F3289 Other specified depressive episodes: Secondary | ICD-10-CM | POA: Insufficient documentation

## 2014-02-04 DIAGNOSIS — Z872 Personal history of diseases of the skin and subcutaneous tissue: Secondary | ICD-10-CM | POA: Insufficient documentation

## 2014-02-04 DIAGNOSIS — G43909 Migraine, unspecified, not intractable, without status migrainosus: Secondary | ICD-10-CM | POA: Insufficient documentation

## 2014-02-04 DIAGNOSIS — D649 Anemia, unspecified: Secondary | ICD-10-CM | POA: Diagnosis not present

## 2014-02-04 DIAGNOSIS — S91009A Unspecified open wound, unspecified ankle, initial encounter: Principal | ICD-10-CM

## 2014-02-04 DIAGNOSIS — S81009A Unspecified open wound, unspecified knee, initial encounter: Secondary | ICD-10-CM | POA: Insufficient documentation

## 2014-02-04 DIAGNOSIS — W278XXA Contact with other nonpowered hand tool, initial encounter: Secondary | ICD-10-CM | POA: Diagnosis not present

## 2014-02-04 DIAGNOSIS — IMO0002 Reserved for concepts with insufficient information to code with codable children: Secondary | ICD-10-CM | POA: Diagnosis not present

## 2014-02-04 DIAGNOSIS — Z79899 Other long term (current) drug therapy: Secondary | ICD-10-CM | POA: Insufficient documentation

## 2014-02-04 DIAGNOSIS — Z860100 Personal history of colon polyps, unspecified: Secondary | ICD-10-CM | POA: Insufficient documentation

## 2014-02-04 DIAGNOSIS — S81809A Unspecified open wound, unspecified lower leg, initial encounter: Secondary | ICD-10-CM | POA: Diagnosis not present

## 2014-02-04 DIAGNOSIS — Z23 Encounter for immunization: Secondary | ICD-10-CM | POA: Diagnosis not present

## 2014-02-04 DIAGNOSIS — J45909 Unspecified asthma, uncomplicated: Secondary | ICD-10-CM | POA: Insufficient documentation

## 2014-02-04 DIAGNOSIS — E669 Obesity, unspecified: Secondary | ICD-10-CM | POA: Insufficient documentation

## 2014-02-04 DIAGNOSIS — Z8739 Personal history of other diseases of the musculoskeletal system and connective tissue: Secondary | ICD-10-CM | POA: Diagnosis not present

## 2014-02-04 DIAGNOSIS — F329 Major depressive disorder, single episode, unspecified: Secondary | ICD-10-CM | POA: Diagnosis not present

## 2014-02-04 DIAGNOSIS — Z8669 Personal history of other diseases of the nervous system and sense organs: Secondary | ICD-10-CM | POA: Insufficient documentation

## 2014-02-04 DIAGNOSIS — Z91040 Latex allergy status: Secondary | ICD-10-CM | POA: Insufficient documentation

## 2014-02-04 DIAGNOSIS — F411 Generalized anxiety disorder: Secondary | ICD-10-CM | POA: Insufficient documentation

## 2014-02-04 DIAGNOSIS — E119 Type 2 diabetes mellitus without complications: Secondary | ICD-10-CM | POA: Insufficient documentation

## 2014-02-04 DIAGNOSIS — F17200 Nicotine dependence, unspecified, uncomplicated: Secondary | ICD-10-CM | POA: Insufficient documentation

## 2014-02-04 DIAGNOSIS — Y9389 Activity, other specified: Secondary | ICD-10-CM | POA: Diagnosis not present

## 2014-02-04 DIAGNOSIS — S81811A Laceration without foreign body, right lower leg, initial encounter: Secondary | ICD-10-CM

## 2014-02-04 DIAGNOSIS — R000 Tachycardia, unspecified: Secondary | ICD-10-CM | POA: Insufficient documentation

## 2014-02-04 DIAGNOSIS — Y929 Unspecified place or not applicable: Secondary | ICD-10-CM | POA: Insufficient documentation

## 2014-02-04 MED ORDER — LIDOCAINE HCL (PF) 1 % IJ SOLN
5.0000 mL | Freq: Once | INTRAMUSCULAR | Status: DC
  Filled 2014-02-04: qty 5

## 2014-02-04 MED ORDER — TETANUS-DIPHTH-ACELL PERTUSSIS 5-2.5-18.5 LF-MCG/0.5 IM SUSP
0.5000 mL | Freq: Once | INTRAMUSCULAR | Status: AC
  Administered 2014-02-04: 0.5 mL via INTRAMUSCULAR
  Filled 2014-02-04: qty 0.5

## 2014-02-04 NOTE — Discharge Instructions (Signed)
Keep wound clean and dry. See a physician if you develop a straining, spreading redness, fevers or new concerns. Have stitches removed in 7-10 days.  If you were given medicines take as directed.  If you are on coumadin or contraceptives realize their levels and effectiveness is altered by many different medicines.  If you have any reaction (rash, tongues swelling, other) to the medicines stop taking and see a physician.   Please follow up as directed and return to the ER or see a physician for new or worsening symptoms.  Thank you. Filed Vitals:   02/04/14 1528  BP: 109/47  Pulse: 107  Temp: 99.1 F (37.3 C)  TempSrc: Oral  Resp: 20  Height: 5\' 4"  (1.626 m)  Weight: 185 lb (83.915 kg)  SpO2: 98%
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
## 2014-02-04 NOTE — ED Notes (Signed)
Laceration to rt lower leg with a saw.

## 2014-05-09 ENCOUNTER — Ambulatory Visit: Payer: Self-pay | Admitting: Internal Medicine

## 2014-05-16 ENCOUNTER — Encounter: Payer: Self-pay | Admitting: *Deleted

## 2014-05-23 ENCOUNTER — Encounter: Payer: Self-pay | Admitting: *Deleted

## 2014-05-23 ENCOUNTER — Ambulatory Visit: Payer: Self-pay | Admitting: Cardiovascular Disease

## 2014-08-04 ENCOUNTER — Encounter: Payer: Self-pay | Admitting: Internal Medicine
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
## 2014-10-02 NOTE — Consult Note (Signed)
Impression: 59yo WF w/ h/o extensive psychiatric disease, chronic pain, DM, NASH admitted with hypotension and confusion.  She does not appear to be confused today and her BP is much better after IVF.  She has been afebrile and her WBC is normal.  Urinalysis shows no inflammation to suggest UTI.  Her CXR shows no infiltrate.  She is on a large amount of pain med, benzodiezapines and psychiatric meds.  Given her chronic liver disease, these could be having intermittent build up of metabolites which could affect her BP and level of consciousness. Agree with psychiatric consultation.  She is very pleased with her response to the psych meds as she is not have hallucination which have affected her since childhood. I do not see any signs of active infection.  Will d/c her antibiotics and watch her fever curve and WBC. She had documented Methacillin Resistant Staph aureus within the past year and will need to remain on isolation.  Electronic Signatures: Doree Kuehne, Heinz Knuckles (MD)  (Signed on 16-Feb-13 15:30)  Authored  Last Updated: 16-Feb-13 15:30 by Meaghan Whistler, Heinz Knuckles (MD)

## 2014-10-02 NOTE — H&P (Signed)
PATIENT NAMEALANYA, [name] MR#:  [id number] DATE OF BIRTH:  11-28-1955  DATE OF ADMISSION:  05/30/2011  PRIMARY CARE PHYSICIAN: [facility]   CHIEF COMPLAINT: Altered mental status for the last 2 or 3 days. History is obtained from the patient's daughter and husband. The patient is very confused, unable to give me any history or review of systems.   HISTORY OF PRESENT ILLNESS: Ms. [name] is a 59 year old Caucasian female with multiple medical problems, including hypertension and asthma/chronic obstructive pulmonary disease, comes to the Emergency Room with altered mental status. Per husband, the patient had a right total knee replacement in May of 2012, and since then she has been on and off on pain medications. Recently, about two weeks ago, she fell using a wheelbarrow outside in her yard and was evaluated in the Emergency Room to make sure she did not have any fractures or damage of her right knee. She was discharged from the Emergency Room and was doing well. She had another fall at home and was seen by Orthopedics in [place], and she had some problems in her pelvic area, per her husband, who is not able to give me much details. The patient had been taking all of her medications, including her oxycodone. She has been having poor p.o. intake for the last few days and in the Emergency Room was found to be in acute renal failure with hypoxemia and rhabdomyolysis. Her white count is elevated to 17.5. She is being admitted for SIRS with suspected early bilateral pneumonia versus atelectasis and acute renal failure.   PAST MEDICAL HISTORY:  1. Nonalcoholic hepatic steatosis/cirrhosis of liver.  2. 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  3. 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  4. 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  5. Cerebral aneurysm, status post surgery.  6. Peripheral neuropathy.  7. Migraine headaches.  8. Recurrent  urinary tract infections.  9. Carpal tunnel syndrome.  10. Sleep apnea.  11. Restless leg syndrome.  12. Fibromyalgia.  13. Arthritis.  14. Peptic ulcer disease.  15. Hypertension.  16. Hyperlipidemia.  17. Gastroesophageal reflux disease.  18. Type 2 diabetes.  19. Depression and anxiety.   PAST SURGICAL HISTORY:  1. Bladder tuck  in 1993.  2. Cerebral aneurysm in 2008.  3. Tonsillectomy at age 67.  74. Laparoscopic surgery of the abdomen for intestinal infection in 1984.   5. Bilateral carpal tunnel releases.   FAMILY HISTORY: From old records, father died of Alzheimer's disease. Mother died of uterine cancer and lung cancer.   SOCIAL HISTORY: She is married, uses about a half-pack a day. There is no alcohol use, per husband.   ALLERGIES: Wellbutrin, Latex and Haldol.   CURRENT HOME MEDICATIONS:  1. Metformin 500 mg p.o. daily.  2. Lisinopril 50 mg daily.  3. Furosemide 40 mg b.i.d.  4. Veramyst 27.5 mcg, 1 spray daily.  5. Topamax 200 mg, 1 at bedtime for migraines.  6. DHE 45, 1 subcutaneous, up to 3 doses in 24 hours for migraines.  7. Migranil 1 puff per nostril, up to 4 puffs as needed.  8. Trazodone 100 mg, 1 at bedtime.  9. Cymbalta 60 mg daily.  10. Diazepam 40 mg t.i.d.  11. Gabapentin 400 mg t.i.d.  12. Ropinirole 5 mg at bedtime.  13. Ropinirole 1 mg p.r.n. for breakthrough restless leg.  14. Meclizine 25 mg t.i.d. p.r.n.  15. ProAir HFA, 1 inhalation as needed.  16. Tizanidine  4 mg for muscle spasm.  17. Oxycodone 10 mg, 1 p.o. every 6 hours as needed for pain.   REVIEW OF SYSTEMS: Review of systems is unobtainable since the patient has altered mental status.   PHYSICAL EXAMINATION:  GENERAL: The patient has altered mental status, is confused.   VITAL SIGNS: She is afebrile, pulse is 100, blood pressure is 114/64. Saturations are 93% on 2 liters.   HEENT: Atraumatic, normocephalic. Pupils are equal, round, and reactive to light and accommodation.  Extraocular movements are intact. Oral mucosa is dry.   NECK: Supple. No JVD, no carotid bruit.   RESPIRATORY: Decreased breath sounds at the bases. No rales, rhonchi, respiratory distress, or labored breathing.   CARDIOVASCULAR: Tachycardia present. No murmur heard. PMI is not lateralized. Chest nontender.   EXTREMITIES: Feeble pedal pulses, good femoral pulses. No lower extremity edema.   SKIN: The patient has bruises in her lower extremities.   ABDOMEN: Soft, benign, and nontender. No organomegaly. Positive bowel sounds.   NEUROLOGICAL: Unable to assess. The patient has altered mental status. She is moving all her extremities well.   SKIN: Warm and dry.   LABORATORY, DIAGNOSTIC AND RADIOLOGICAL DATA:  Ultrasound of the kidneys negative for any hydronephrosis.  CT of the head without contrast is within normal limits.  CT of the chest without contrast shows bilateral new atelectasis and/or early infiltrate. No congestive heart failure.  Arterial blood gas: pH is 7.37, pCO2 is 41, pO2 54 on room air with saturations 86%.  Chest x-ray shows no acute changes identified.  White count is 17.5, hemoglobin and hematocrit is 13.7 and 40.7, platelet count is 68, MCV is 90.  Glucose is 240, BUN 61, creatinine 3.84, sodium is 134, potassium is 3.4, chloride is 96, bicarbonate is 25, calcium is 9.4, bilirubin 1.2, SGPT 95, SGOT is 367, total protein 7.2, albumin is 3.3.  Troponin is 0.59. PT-INR is 15.6 and 1.2.  Serum ethanol is less than 0.003.  Urinalysis positive for urinary tract infection.  Urine drug screen positive for MDMA, opiates, and benzodiazepines.   ASSESSMENT: Ms. [name] is a 59 year old with:  1. Altered mental status/encephalopathy suspected due to severe dehydration, early pneumonia, urinary tract infection, p.o. narcotics, poor p.o. intake.  2. Acute rhabdomyolysis.  3. SIRS with suspected early bibasilar pneumonia versus atelectasis and urinary tract infection.  4. Chronic  pain with p.o. narcotics.  5. Elevated liver function tests. Etiology unclear other than hepatic steatosis in the setting of  cell failure.  6. Acute renal failure, baseline creatinine is normal.  7. Type 2 diabetes.  8. Chronic smoker with history of chronic obstructive pulmonary disease.  9. Urinary tract infection.   PLAN:  1. Admit the patient to telemetry floor.  2. Continue IV fluids for hydration. We will keep the patient n.p.o. until  she is awake and alert enough.  3. Start on Zithromax and IV Rocephin.  4. We will use TEDs for deep vein thrombosis prophylaxis. Unable to use much antiplatelet agents secondary to the patient having thrombocytopenia.  5. We will use ibuprofen for as needed for pain and fevers since the patient has elevated LFTs.   CODE STATUS:  FULL CODE.    ____________________________ Hart Rochester. Posey Pronto, MD sap:cbb D: 05/30/2011 17:58:00 ET T: 05/30/2011 19:18:50 ET JOB#: [id number]  cc: Vinessa Macconnell A. Posey Pronto, MD, <Dictator> [place] MD ELECTRONICALLY SIGNED 06/13/2011 14:43

## 2014-10-02 NOTE — Discharge Summary (Signed)
PATIENT NAMESARABI, [name] MR#:  [id number] DATE OF BIRTH:  April 10, 1956  DATE OF ADMISSION:  05/30/2011 DATE OF DISCHARGE:  06/06/2011  PRIMARY CARE PHYSICIAN: [facility]   DISCHARGE DIAGNOSES:   FINAL DIAGNOSES: 1. Escherichia coli sepsis secondary to urinary tract infection, not pneumonia.  2. Encephalopathy.  3. Rhabdomyolysis.  4. Acute renal failure.  5. Chronic pain.  6. Anxiety and depression.  7. Diabetes.  8. Chronic obstructive pulmonary disease, tobacco abuse, and sleep apnea.  9. Hypophosphatemia, hypokalemia, and hypomagnesemia.  10. Weakness.  11. Nonalcoholic cirrhosis.  12. Thrombocytopenia.  13. History of cerebral aneurysm and repair.   DISCHARGE MEDICATIONS:  1. K-Dur 10 milliequivalents p.o. daily.  2. Valium 10 mg p.o. nightly.  3. Levaquin 500 mg p.o. nightly for 11 more days.  4. Glucophage XR 500 mg p.o. daily. 5. NovoLog sliding scale before meals and at bedtime, as written on prescription. 6. Imodium 2 mg p.o. p.r.n. diarrhea.  7. Oxycodone 5 mg p.o. every six hours p.r.n. pain.  8. Topamax 200 mg p.o. nightly.  9. Valium 2 mg every eight hours as needed for anxiety. 10. Cymbalta 90 mg p.o. daily.  11. Magnesium oxide 400 mg p.o. twice a day. 12. Neurontin 300 mg p.o. three times daily. 13. Albuterol inhaler 2 puffs every six hours as needed for shortness of breath.   ACTIVITY: As tolerated.   DIET: 1800 ADA diet.   DISCHARGE FOLLOWUP/INSTRUCTIONS: Followup with the doctor at rehab. Recommend checking a BMP, magnesium and phosphorus in one week.   REASON FOR ADMISSION: The patient was admitted 05/30/2011 and discharged 06/06/2011. The patient came in with altered mental status/encephalopathy and found to be in acute rhabdomyolysis, sepsis. She was admitted to the telemetry floor, given IV fluid hydration and started on Zithromax and Rocephin for suspected pneumonia. All of her medications that can cause altered mental status were  stopped. She was brought in with altered mental status having it for the past 2 to 3 days and was unable to give any history at the time of admission. History was obtained from the family.   LABS/STUDIES: EKG showed normal sinus rhythm.   Urine culture grew out Escherichia coli.   Urine toxicology was positive for MDMA, opiates, and benzodiazepine.   Urinalysis showed 3+ leukocyte esterase.   Blood cultures, four of four bottles, grew out Escherichia coli which was intermediate sensitivity to the ceftriaxone and sensitive to Levaquin.  Ethanol level negative. CPK 8586, PT 15.6, INR 1.2, and PTT 39.4. Troponin borderline at 0.59. Glucose 240, BUN 61, creatinine 3.84, sodium 134, potassium 3.4, chloride 96, CO2 25, AST elevated at 367, and ALT 95. White blood cell count 17.5, hemoglobin and hematocrit 13.7 and 40.7, and platelet count 68.   Chest x-ray: No acute changes.   ABG: pH 7.37, pCO2 41, pO2 54, bicarbonate 23.7, and oxygen saturation 86.6.   CT scan of the chest showed bibasilar atelectasis and/or early infiltrate.   CT of the head showed no acute intracranial abnormality.   Ultrasound of the kidneys showed no hydronephrosis.  CPK trended down to 5601 on 05/30/2011. Creatinine on 05/31/2011 was 2.52 and potassium 3.1. White count was down to 11.8.   Echo on 05/31/2011 showed an ejection fraction of greater than 55%.  Ultrasound of the abdomen was negative.   CPK was down to 3988, on 05/31/2011. On 06/01/2011 creatinine was down to 1.26 and potassium 3.0. CPK was down to 2344 and phosphorus 1.4.  Stool for C. difficile  was negative, on 06/02/2011.   On 06/02/2011 CPK was down to 686. Creatinine was 1.10, potassium 2.8, and phosphorus 1.8.   Repeat CT scan of the head was negative, on 06/02/2011.   T4 and TSH were in normal range. B12 was very elevated. Folic acid was 65.7. CPK was down, on 06/03/2011, to 238. Last creatinine was 0.75, last potassium was 3.8, and last  magnesium was 1.9.   HOSPITAL COURSE PER PROBLEM LIST:  1. Escherichia coli sepsis: Initially the patient was placed on Rocephin and Zithromax thinking that this was a pneumonia, but the urine culture grew out Escherichia coli and blood cultures grew out Escherichia coli. It was intermediate sensitivity to the Rocephin. She was switched to Levaquin. She will need a total 14 day course of the Levaquin, another 11 days. The patient has improved tremendously since coming in.  2. Encephalopathy: This was thought most likely secondary to the Escherichia coli sepsis. Encephalopathy has improved during the hospitalization. There was a delirium ongoing with the infection. The patient was on numerous pain medications and psychiatric medications that were initially held and then had to be restarted at lower doses. She was seen in consultation by Dr. Marlowe Alt, psychiatry, who adjusted up the Cymbalta.  3. Rhabdomyolysis: Muscle enzyme was improved during the hospitalization with IV fluid hydration thinking that the rhabdomyolysis was caused secondary to the Escherichia coli sepsis. 4. Acute renal failure: This had improved throughout the hospitalization with IV fluid hydration. She was seen in consultation by nephrology who signed off since the kidney function had improved.  5. Chronic pain: She has a nerve stimulator in her back, which she is able to use. Lower dose pain medication, oxycodone, was given on an as needed basis.  6. Anxiety and depression: The patient is on low dose Valium p.r.n., Topamax at night. The dose of Cymbalta was increased by Dr. Marlowe Alt, psychiatry.  7. Diabetes: Her Glucophage was held initially when she came in and she was just on sliding scale. This can be restarted at low dose upon discharge to the rehab.  8. Chronic obstructive pulmonary disease, tobacco abuse, and sleep apnea: During the hospitalization she was on oxygen when she came in with the altered mental status but  this has improved. She really has not needed any respiratory treatments here. She can use her p.r.n. albuterol at home.  9. Hypophosphatemia, hypokalemia, and hypomagnesemia: All these electrolytes have been replaced during the hospitalization. A low dose replacement of potassium and magnesium will be continued as an outpatient. Most likely this was secondary to the poor appetite and altered mental status. Recommend checking a BMP, magnesium, and phosphorous in one week.  10. Weakness: She will need physical therapy at rehab.  11. Non-alcoholic cirrhosis and she has chronic thrombocytopenia.  12. Thrombocytopenia: This was watched during the hospitalization. It is chronic for her. Last platelet count was 59.  13. History of cerebral aneurysm, status post repair.   TIME SPENT ON DISCHARGE: 40 minutes. ____________________________ Tana Conch. Leslye Peer, MD rjw:slb D: 06/06/2011 10:21:14 ET T: 06/06/2011 10:35:31 ET JOB#: [id number]  cc: Tana Conch. Leslye Peer, MD, <Dictator> [place] MD ELECTRONICALLY SIGNED 06/26/2011 16:01

## 2014-10-02 NOTE — H&P (Signed)
PATIENT NAMEEVERLEE, [name] MR#:  [id number] DATE OF BIRTH:  20-Sep-1955  DATE OF ADMISSION:  07/26/2011  PRIMARY CARE PHYSICIAN: [facility] REQUESTING PHYSICIAN: Dr. Jasmine December  CHIEF COMPLAINT: Dizziness and low blood pressure.   HISTORY OF PRESENT ILLNESS: Patient is a 59 year old female who was recently discharged from the hospital in December 2012 after Escherichia coli sepsis secondary to urinary tract infection, was doing well at home and was followed by home nurse, PT, OT and speech therapy. She did have some intermittent swallowing difficulty. She states she has some good days and some bad days when she would choke her food and pills. Usually she describes at least 2 to 3 bad days of seven days in a week. She was doing overall well with her therapy but yesterday her blood pressure was borderline low. When she saw her primary care physician he requested he to hold her Lasix and cut back on her diazepam. This morning when the home nurse came her blood pressure was 60/30 and she requested her to come to the Emergency Department. While in the ED, her pressure is still in low 70s and her chest x-ray showed possible density at the left costophrenic angle suggestive of possible pneumonia. When I evaluated the patient in the room her husband was at the bedside. Patient seemed quite sleepy was falling to sleep intermittently while I was talking but she was able to communicate okay.   She denies any other symptoms other than feeling dizzy periodically since last discharge.   She denied any fever or upper respiratory tract symptoms.   PAST MEDICAL HISTORY:  1. Nonalcoholic hepatic steatosis/cirrhosis of the liver.  2. 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  3. 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  4. 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  5. Cerebral aneurysm status post surgery.  6. Peripheral neuropathy.  7. Migraine headache.   8. Chronic pain.  9. Sleep apnea.  10. Carpal tunnel syndrome. 11. Recurrent urinary tract infection. 12. Restless leg syndrome.  13. Fibromyalgia. 14. Arthritis. 15. Peptic ulcer disease.  16. Hypertension.  17. Hyperlipidemia.  18. Gastroesophageal reflux disease. 19. Type 2 diabetes. 20. Depression, anxiety.   PAST SURGICAL HISTORY:  1. Bladder tack surgery 1993.  2. Cerebral aneurysm.  3. Tonsillectomy.  4. Laparoscopic surgery of the abdomen. 5. Bilateral carpal tunnel release.   FAMILY HISTORY: Father died of Alzheimer's disease. Mother died of uterine and lung cancer.   SOCIAL HISTORY: She is married. Uses 1/2 pack of cigarettes daily. No alcohol.   ALLERGIES: Wellbutrin, latex and Haldol.   MEDICATIONS AT HOME:  1. Meclizine 25 mg p.o. daily as needed. 2. Zanaflex 4 mg p.o. q.6 hours as needed.  3. Oxycodone 10 mg p.o. every six hours as needed.  4. Requip 1 mg p.o. daily as needed and 5 mg p.o. at bedtime.  5. Trazodone 150 mg p.o. at bedtime.  6. Lasix 40 mg p.o. daily which was stopped yesterday.  7. Gabapentin 300 mg p.o. t.i.d.  8. Klor-Con 10 mEq p.o. daily.  9. Diazepam 2 mg p.o. every eight hours as needed and 5 mg p.o. at bedtime.  10. ProAir 1 puff every 4 to 6 hours as needed.  11. Metformin 500 mg p.o. daily.  12. Topamax 200 mg p.o. daily.  13. Cymbalta 30 mg 3 capsules p.o. daily.  14. Migranal one spray in each nostril as needed.    REVIEW OF SYSTEMS: CONSTITUTIONAL: No fever. Positive  for fatigue and weakness, weight loss of 60 pounds in the last one month due to poor p.o. intake. Also chronic pain. EYES: No blurred or double vision. ENT: No tinnitus or ear pain. RESPIRATORY: No cough, wheezing, hemoptysis. CARDIOVASCULAR: No chest pain, orthopnea, edema. GASTROINTESTINAL: No nausea, vomiting, diarrhea. GENITOURINARY: No dysuria, hematuria. ENDOCRINE: No polyuria, nocturia. HEMATOLOGY: No anemia or easy bruising. SKIN: No rash or lesion.  MUSCULOSKELETAL: Chronic pain, fibromyalgia. NEUROLOGIC: No tingling, numbness, weakness. She does feel dizzy intermittently for last several weeks since last discharge. PSYCHIATRY: History of anxiety and depression.   PHYSICAL EXAMINATION:  VITAL SIGNS: Temperature 96.7, heart rate 70 per minute, respirations 14 per minute, blood pressure 87/44 mmHg. She is saturating 98% on room air.   GENERAL: Patient is a 59 year old female lying in the bed comfortably without any acute distress.   EYES: Pupils equal, round, reactive to light and accommodation. No scleral icterus. She seems sleepy.   HENT: Head atraumatic, normocephalic. Oropharynx and nasopharynx dry and clear.   NECK: Supple. No jugular venous distention. No thyroid enlargement or tenderness.   LUNGS: Clear to auscultation bilaterally. No wheezing, rales, rhonchi, crepitation.   CARDIOVASCULAR: S1, S2 normal. No murmur, rubs, or gallop.   ABDOMEN: Soft, nontender, nondistended. Bowel sounds present. No organomegaly or masses.   EXTREMITIES: No pedal edema, cyanosis, or clubbing.   NEUROLOGIC: Nonfocal examination. She seems sleepy, falling to sleep intermittently while talking. Cranial nerves II through XII intact. Muscle strength 5/5. Sensation intact.   PSYCHIATRIC: Patient is somewhat lethargic. Mood seems appropriate. Affect possibly depressed.    SKIN: Dry and intact.   LABORATORY, DIAGNOSTIC AND RADIOLOGICAL DATA: Normal BMP except BUN of 23, creatinine 1.39. Normal liver function tests. Normal first set of cardiac enzymes. Normal CBC except platelet count 82. Normal coagulation panel. Negative urinalysis. Normal lactic acid of 1.   Chest x-ray shows minimal density at the left costophrenic angle.   EKG shows sinus bradycardia with a rate of 55 per minute.   IMPRESSION AND PLAN:  1. Hypotension, likely multifactorial with possible infection although it seems less likely without any obvious symptoms. She does have some  density on the chest x-ray which certainly could be pneumonia. Will empirically treat her with antibiotic. Consult infectious disease. It also could be over pain medication or use of benzodiazepine and blood pressure medication (Lasix and lisinopril) which has been recently cut back by her primary care physician. although it's not in her meds list she tells me that she was taking it, she seem to be confused at times. It could be also dehydration with poor p.o. intake. She indicates 60 pound weight loss in last one month. Will admit her to Critical Care Unit.  2. Possible pneumonia. She does not really have any upper respiratory tract symptoms, no fever, no leukocytosis, although chest x-ray shows some density and now she is hypotensive. Will empirically treat her with Levaquin and Zosyn. Will consult infectious disease and if felt appropriate by Dr. Clayborn Bigness certainly antibiotics can be stopped at that point. Will monitor in Critical Care Unit where she still remains hypotensive.  3. Acute renal failure likely prerenal but cannot rule out ATN as she remains hypotensive. Will hold Lasix. Hold all other blood pressure medication. Avoid any nephrotoxins. She already has a Foley placed.  4. Chronic pain. Will hold off all her oxycodone and other pain medication. She already has a pain stimulator which can be used if needed for pain control.  5. Possible depression. Will consult  psychiatry as she is on several different psychiatric medication including Cymbalta, Requip, diazepam, Topamax to see if she needs any adjustment of the medications. For now will hold off diazepam and see how she does as she is still sleepy.  6. CODE STATUS: FULL CODE. She remains at high risk for cardiorespiratory arrest. Considering her overall medical comorbidities will admit her to Critical Care Unit. She is very hypotensive and will see how she does with hydration and holding her pain medication and benzodiazepine.   TOTAL TIME TAKING  CARE OF THIS PATIENT: 55 minutes (critical care).   ____________________________ Lucina Mellow. Manuella Ghazi, MD vss:cms D: 07/26/2011 19:47:05 ET T: 07/27/2011 08:05:27 ET JOB#: [id number]  cc: Blaike Vickers S. Manuella Ghazi, MD, <Dictator> [place] MD ELECTRONICALLY SIGNED 07/27/2011 23:53

## 2014-10-02 NOTE — Discharge Summary (Signed)
PATIENT NAMEAYRIEL, [name] MR#:  [id number] DATE OF BIRTH:  1955-06-13  DATE OF ADMISSION:  07/26/2011 DATE OF DISCHARGE:  07/29/2011  DISCHARGE DIAGNOSES:  1. Dizziness and hypotension secondary to medication-induced, resolved.  2. Acute renal failure due to hypertension, resolved. 3. Chronic back pain.  4. History of hallucinations since childhood.  5. Diabetes mellitus type 2.  6. Peripheral neuropathy.  7. Sleep apnea.  8. Migraine headaches.  9. Nonalcoholic steatohepatitis with liver cirrhosis.  10. Hypertension. 11. Hyperlipidemia. 12. Gastroesophageal reflux disease. 13. Restless leg syndrome. 14. Fibromyalgia.  15. Chronic obstructive pulmonary disease.   DISCHARGE MEDICATIONS: Ventolin 2 puffs every six hours p.r.n.   DISCHARGE MEDICATIONS:  1. Glucophage 500 mg p.o. daily. 2. K-Dur 10 milliequivalents p.o. daily.  3. Oxycodone 5 mg every 6 hours p.r.n. pain. 4. Topamax 200 mg p.o. daily.  5. Cymbalta 30 mg three capsules once a day.  6. Magnesium oxide 400 mg p.o. twice a day. 7. Neurontin 300 mg p.o. three times daily. 8. Requip 5 mg at bedtime.  DO NOT TAKE THE FOLLOWING MEDICATIONS: Valium, losartan, or furosemide.   DIET: Low sodium diet.   CONSULTANTS:  1. Lanae Boast, MD - Infectious Disease. 2. Camie Patience, MD - Psychiatry. 3. Physical Therapy.    DRUG ALLERGIES: Haldol, Wellbutrin, latex.  DISCHARGE INSTRUCTIONS: The patient is advised to see a family doctor in 1 to 2 weeks.   HOSPITAL COURSE: This is a 59 year old female with multiple medical problems of nonalcoholic steatohepatitis, diabetes, chronic obstructive pulmonary disease, chronic pain, and prolonged psychiatric history involving hallucinations child childhood admitted because of low blood pressure and confusion. The patient's blood pressure was found to be systolic in the 03U on multiple readings done with a home nurse. The patient did not have any fevers or chills. She had some cough,  but the patient did not have any dysuria or rashes. The patient's Lasix was discontinued before she came to the hospital and also diazepam dose was decreased. The patient is admitted to the hospitalist service for pneumonia and also with hypotension, dizziness, and acute renal failure. 1. Dizziness and hypotension, likely multifactorial and secondary to multiple psych medications with low clearance because of her liver disease. The patient's mental status has improved tremendously and is back to baseline. The patient was advised to take pain medication only if she needs it for her back pain, which is oxycodone. She has a TENS unit for her back, which she can use when she has back pain.  2. Hypertension,  resolved with IV fluids. She received normal saline. Blood pressure this morning is around 126/67 and pulse 66.   3. A part from that, her cultures did not show any growth. Blood cultures have been negative. Urine cultures have been negative. Her WBC has been around 4.2. She did not have any fever spikes. She had a normal white count. Initially she was started on Zosyn and vancomycin, but Infectious Disease, Dr. Clayborn Bigness, saw the patient and he recommended to stop the antibiotics as she has no signs of infection, a chest x-ray was negative with normal white count and cultures being negative. Since she has been off antibiotics, she has remained afebrile and her white count was around 3 yesterday. The patient also did not have any fever. The patient stayed hemodynamically stable.  4. Acute renal failure, likely secondary to dehydration. The patient's BUN and creatinine were elevated on admission with a BUN of 26 and a creatinine of 1.39. This improved with  IV fluids with the most recent BUN and creatinine, that is yesterday, showing a BUN of 12 and a creatinine of 0.86. So we thought ATN secondary to hypotension episode at home, also dehydration, which resolved very well.  5. The patient has diabetes mellitus.  Metformin was on hold because of her renal insufficiency. She is on metformin 500 mg daily, which she can continue as tolerated. Blood sugars have been around 161.  6. The patient has history of hallucinations since childhood. She was seen by Dr. Camie Patience who recommended to continue Topamax. The patient said her hallucinations are much better. 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  7. History of chronic obstructive pulmonary disease with no problems at this time. She can continue Ventolin inhalers as needed.  8. The patient has been taking Valium for her back pain. She actually was advised by her primary doctor before coming in here to taper off, so I stopped the Valium. She did not get any Valium during this hospitalization. The patient is okay with that.  9. Restless leg syndrome. She is on Requip 5 mg at bedtime, which she can continue. The patient's condition is stable.   TIME SPENT ON DISCHARGE PREPARATION: More than 30 minutes.  ____________________________ Epifanio Lesches, MD sk:slb D: 07/29/2011 11:06:29 ET T: 07/29/2011 11:36:31 ET JOB#: [id number]  cc: Epifanio Lesches, MD, <Dictator> Epifanio Lesches MD ELECTRONICALLY SIGNED 07/30/2011 13:30

## 2014-10-02 NOTE — Consult Note (Signed)
PATIENT NAMEAHLAYA, [name] MR#:  [id number] DATE OF BIRTH:  [birth year]  DATE OF CONSULTATION:  07/27/2011  REFERRING PHYSICIAN:  Dr. Karsten Fells  CONSULTING PHYSICIAN:  Heinz Knuckles. Baker Kogler, MD  REASON FOR CONSULTATION: Hypotension and mental status changes.   HISTORY OF PRESENT ILLNESS: Patient is a 59 year old white female with a past history significant for diabetes, nonalcoholic hepatic steatosis, chronic pain and a prolonged psychiatric history involving hallucinations since childhood who was admitted yesterday with low blood pressure and confusion. Patient states that she was seen by several therapists over the course of the day in her home and her blood pressure had been checked each time and was increasingly lower. At one point she was in the systolic pressure of 78E. She was brought to the Emergency Room and was found to have systolic pressure in the 42P. She states that she has intermittent days of being fairly confused and other days she does fairly well. She denies any recent fevers, chills, or sweats. She has had a cough with some dark sputum at times and has chronic congestion in her nose. She has not had significant shortness of breath. She does have some chest pain over the mid sternal area that occurs intermittently but is not exertionally related. She has not had any nausea or vomiting or change in her bowels. She has not had any dysuria. She does have chronic back pain which is unchanged. She has had no rashes that she has noted. Her only medication changes have been a decrease in her diazepam which occurred one day prior to admission and discontinuation of Lasix. She has had problems with lower extremity edema in the past that is not particularly bothering her currently. She was admitted to the hospital and started on levofloxacin and Zosyn. Her white count on admission was 5.6. She has remained afebrile and her mental status has dramatically improved. Her blood pressure monitored in the  Critical Care Unit have improved with IV fluids.   ALLERGIES: Haldol, Wellbutrin and Lasix.   PAST MEDICAL HISTORY:  1. Chronic psychiatric illness. She has had multiple diagnoses over the years. She states that since she was a young girl she had been having hallucinations and hearing voices. Sometimes these have been telling her to harm herself and she has had suicide attempts in the past. This constellation of symptoms does seem to indicate a schizophrenia diagnosis, however, she did not volunteer that as the diagnosis.  2. Nonalcoholic hepatic steatosis.  3. Splenomegaly with thrombocytopenia.  4. Degenerative joint disease of the spine.  5. 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  6. Cerebral aneurysm status post surgery. She has residual word finding problems since the surgery.  7. Peripheral neuropathy.  8. Migraines.  9. Chronic back pain.  10. Sleep apnea.  11. Fibromyalgia.  12. Peptic ulcer disease.  13. Hypertension.  14. Hypercholesterolemia.  15. Reflux.  16. Diabetes.   FAMILY HISTORY: Positive for Alzheimer's disease, uterine cancer and lung cancer.   SOCIAL HISTORY: She is married and lives with her husband. She smokes 1/2 pack to a pack of cigarettes per day. She does not drink. She has dogs at home.    REVIEW OF SYSTEMS: GENERAL: No fevers, chills, or sweats. She has had some fatigue at times and confusion. HEENT: Intermittent headaches but none recently. Positive sinus congestion, nasal congestion. Some occasional sore throat. She has had some problems with swallowing at times and has lost some weight. NECK: No stiffness. No swollen glands. RESPIRATORY: She had  a cough productive of some darkish sputum. No significant shortness of breath. No wheezing. CARDIAC: No chest pains or palpitations other than some mid sternal chest discomfort that has not been associated with exertion. She has had peripheral edema in the past but this has not been a problem more recently.  GASTROINTESTINAL: No nausea. No vomiting. No abdominal pain. No change in her bowels. GENITOURINARY: No change in her urine. MUSCULOSKELETAL: She has chronic back pain but no frank joint swelling or redness. NEUROLOGIC: She has trouble with word finding since her cerebral aneurysm surgery. She has some chronic tremor. She has had some intermittent lethargy over the last several weeks. PSYCHIATRIC: She has not had any recent hallucinations or delusions. Her mood has been fairly good. All other systems are negative.   PHYSICAL EXAMINATION:  VITAL SIGNS: T-max 98.2, pulse 88, blood pressure 117/53, 96% on room air.   GENERAL: 59 year old white female in no acute distress eating lunch.   HEENT: Normocephalic, atraumatic. Pupils equal, reactive to light. Extraocular motion intact. Sclerae, conjunctivae, and lids are without evidence for emboli or petechiae. Oropharynx shows no erythema or exudate. Teeth and gums are in fair condition.   NECK: Supple. Full range of motion. Midline trachea. No lymphadenopathy. No thyromegaly.   CHEST: Clear to auscultation bilaterally with good air movement. No focal consolidation.   CARDIAC: Regular rate and rhythm without murmur, rub, or gallop.   ABDOMEN: Soft, nontender, nondistended. No hepatosplenomegaly. No hernia is noted.   EXTREMITIES: No evidence for tenosynovitis.   SKIN: No rashes. No stigmata of endocarditis, specifically no Janeway lesions nor Osler nodes.   NEUROLOGIC: The patient was awake and interactive. She was able to provide a fairly coherent history of both her recent and past events in her medical history. She had difficulty with word finding occasionally but recognized this as a problem and was not particularly frustrated over it. She would eventually come up with the word she was looking for. She was moving all four extremities.   PSYCHIATRIC: Mood and affect appeared normal.   LABORATORY, DIAGNOSTIC, AND RADIOLOGICAL DATA: BUN 18,  creatinine 1.10, bicarbonate 27, anion gap 9. LFTs are unimpressive. White count 4.2 with a hemoglobin 11.4, platelet count 64, ANC 2.4. White count on admission 5.6. Blood cultures from admission show no growth. A urinalysis is unremarkable and a urine culture shows no growth to date. A chest x-ray showed minimal density in left costophrenic angle.   IMPRESSION: 59 year old white female with past history significant for an extensive psychiatric disease, chronic pain, diabetes and NASH admitted with hypotension and confusion.   RECOMMENDATIONS:  1. She does not appear to be confused today and her blood pressure is much better after IV fluids. She has been afebrile and her white count is normal. Urinalysis shows no inflammation to suggest urinary tract infection. Her chest x-ray shows no infiltrate. The changes of the costophrenic angle are probably insignificant. She is going for a PA and lateral chest x-ray today. She is on a large amount of pain medicines, benzodiazepines and psychiatric medications. Given her chronic liver disease these could be having intermittent build up of metabolites which could affect her blood pressure and level of consciousness.  2. I agree with psychiatric consultation. She is very pleased with response to the psychiatric medications that she was given at her last hospitalization as she has not having hallucinations which have affected her since childhood.  3. I do not see any signs of active infection. Will discontinue her antibiotics and  watch her fever curve and white blood count.  4. 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   This is a moderately complex infectious disease case. Thank you very much for involving me in Ms. Nobles's care.   ____________________________ Heinz Knuckles. Torris House, MD meb:cms D: 07/27/2011 15:40:18 ET T: 07/27/2011 16:08:42 ET JOB#: [id number]  cc: Heinz Knuckles. Djuana Littleton, MD, <Dictator> Caz Weaver E  Corretta Munce MD ELECTRONICALLY SIGNED 07/29/2011 13:08

## 2014-10-02 NOTE — H&P (Signed)
PATIENT NAMEJACOBA, [name] MR#:  [id number] DATE OF BIRTH:  1955/10/11  DATE OF ADMISSION:  07/26/2011  ADDENDUM:  IMPRESSION AND PLAN:  Diabetes: We will hold her metformin, put her on sliding scale insulin and monitor on ADA diet.  Tobacco abuse: The patient was counseled for about three minutes. She does not indicate need for nicotine patch while in the hospital.   ____________________________ Lucina Mellow. Manuella Ghazi, MD vss:bjt D: 07/26/2011 19:48:45 ET T: 07/27/2011 07:56:25 ET JOB#: [id number]  cc: Joshuajames Moehring S. Manuella Ghazi, MD, <Dictator> Lucina Mellow [facility] MD ELECTRONICALLY SIGNED 07/27/2011 23:51

## 2014-10-13 IMAGING — CR DG CHEST 2V
1 series · 2 of 2 positions shown · non-contrast
Comparison: none

REASON FOR EXAM: htn
COMMENTS:

[Series 1: w chest pa · 0.14mm/px · 2 of 2 slices shown]
[im 1/2]
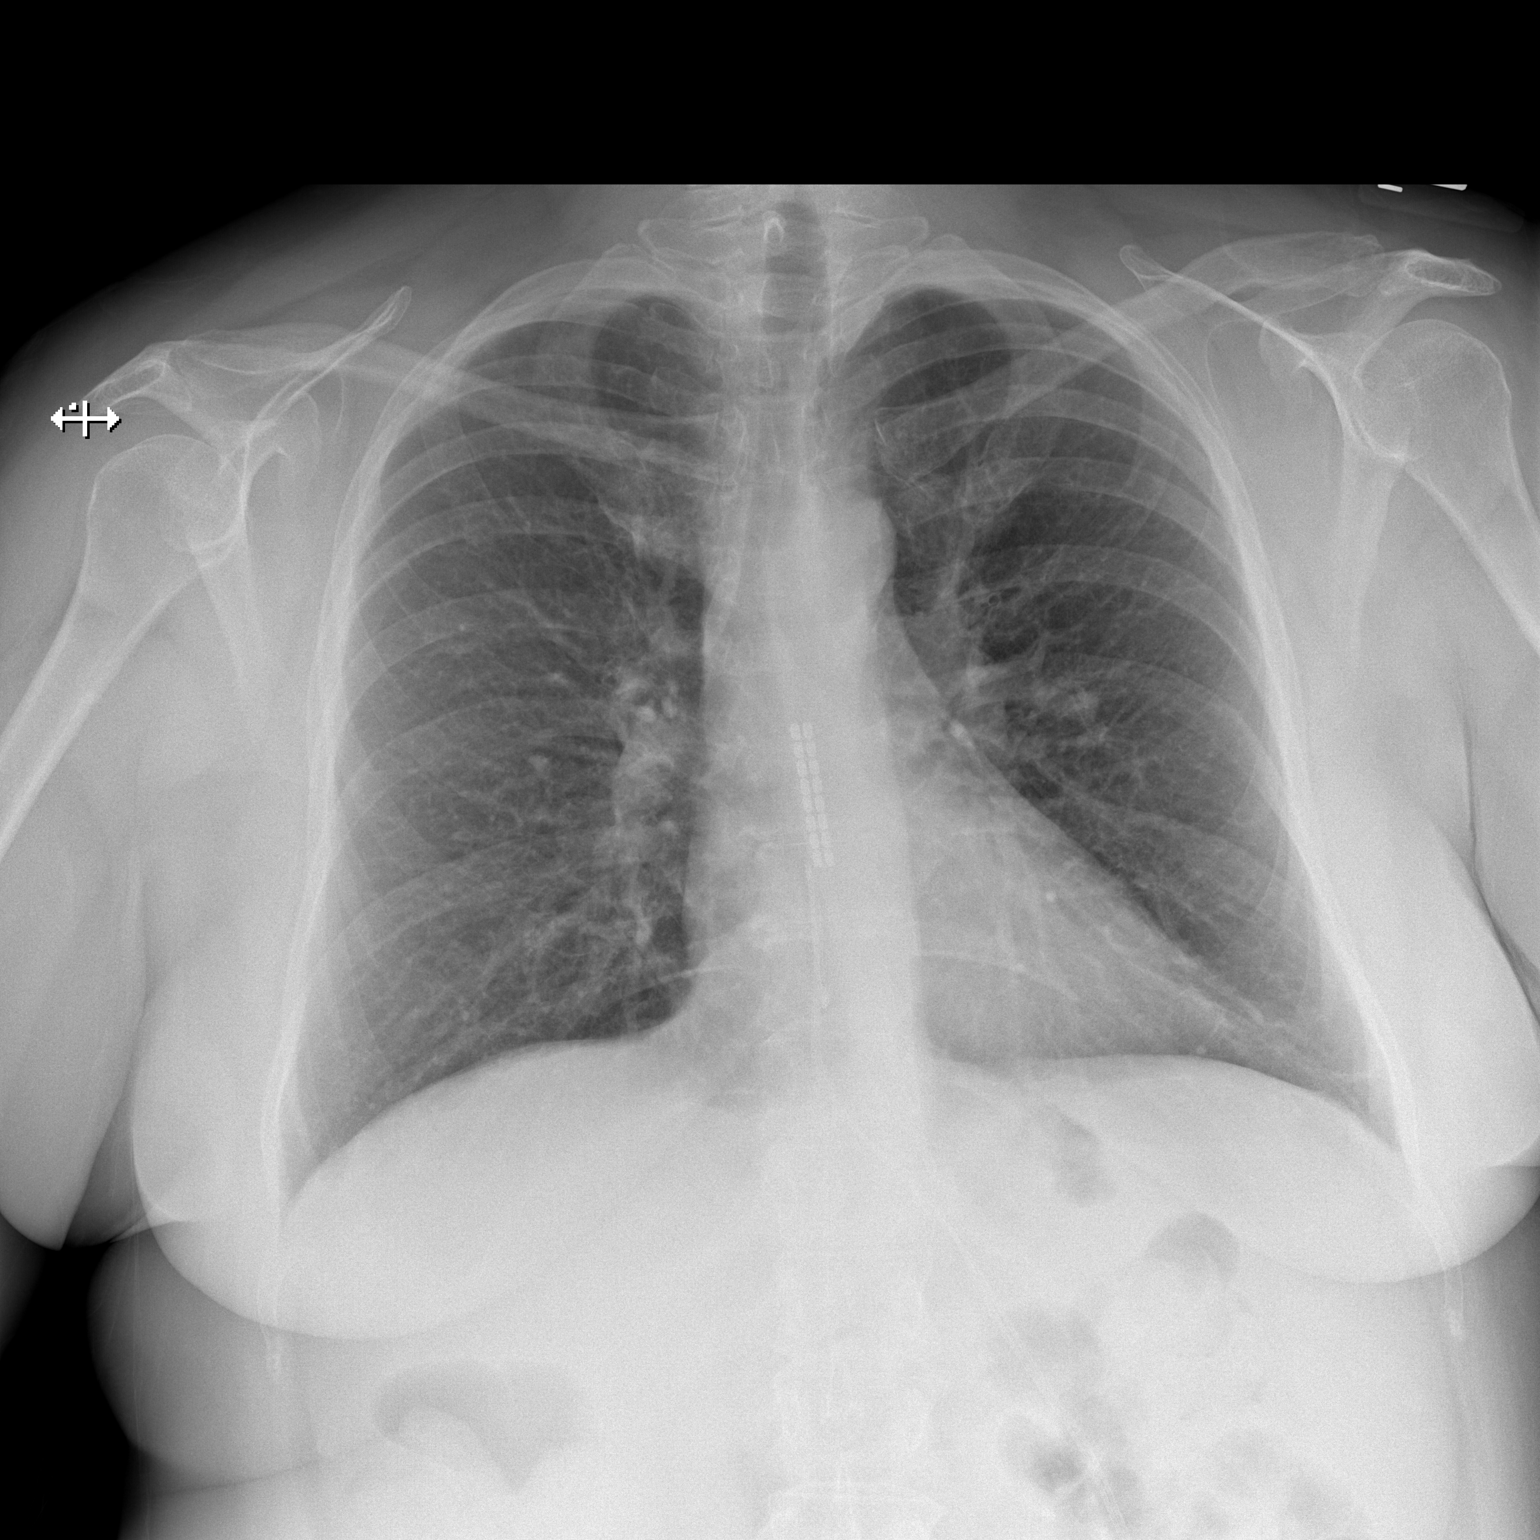
[im 2/2]
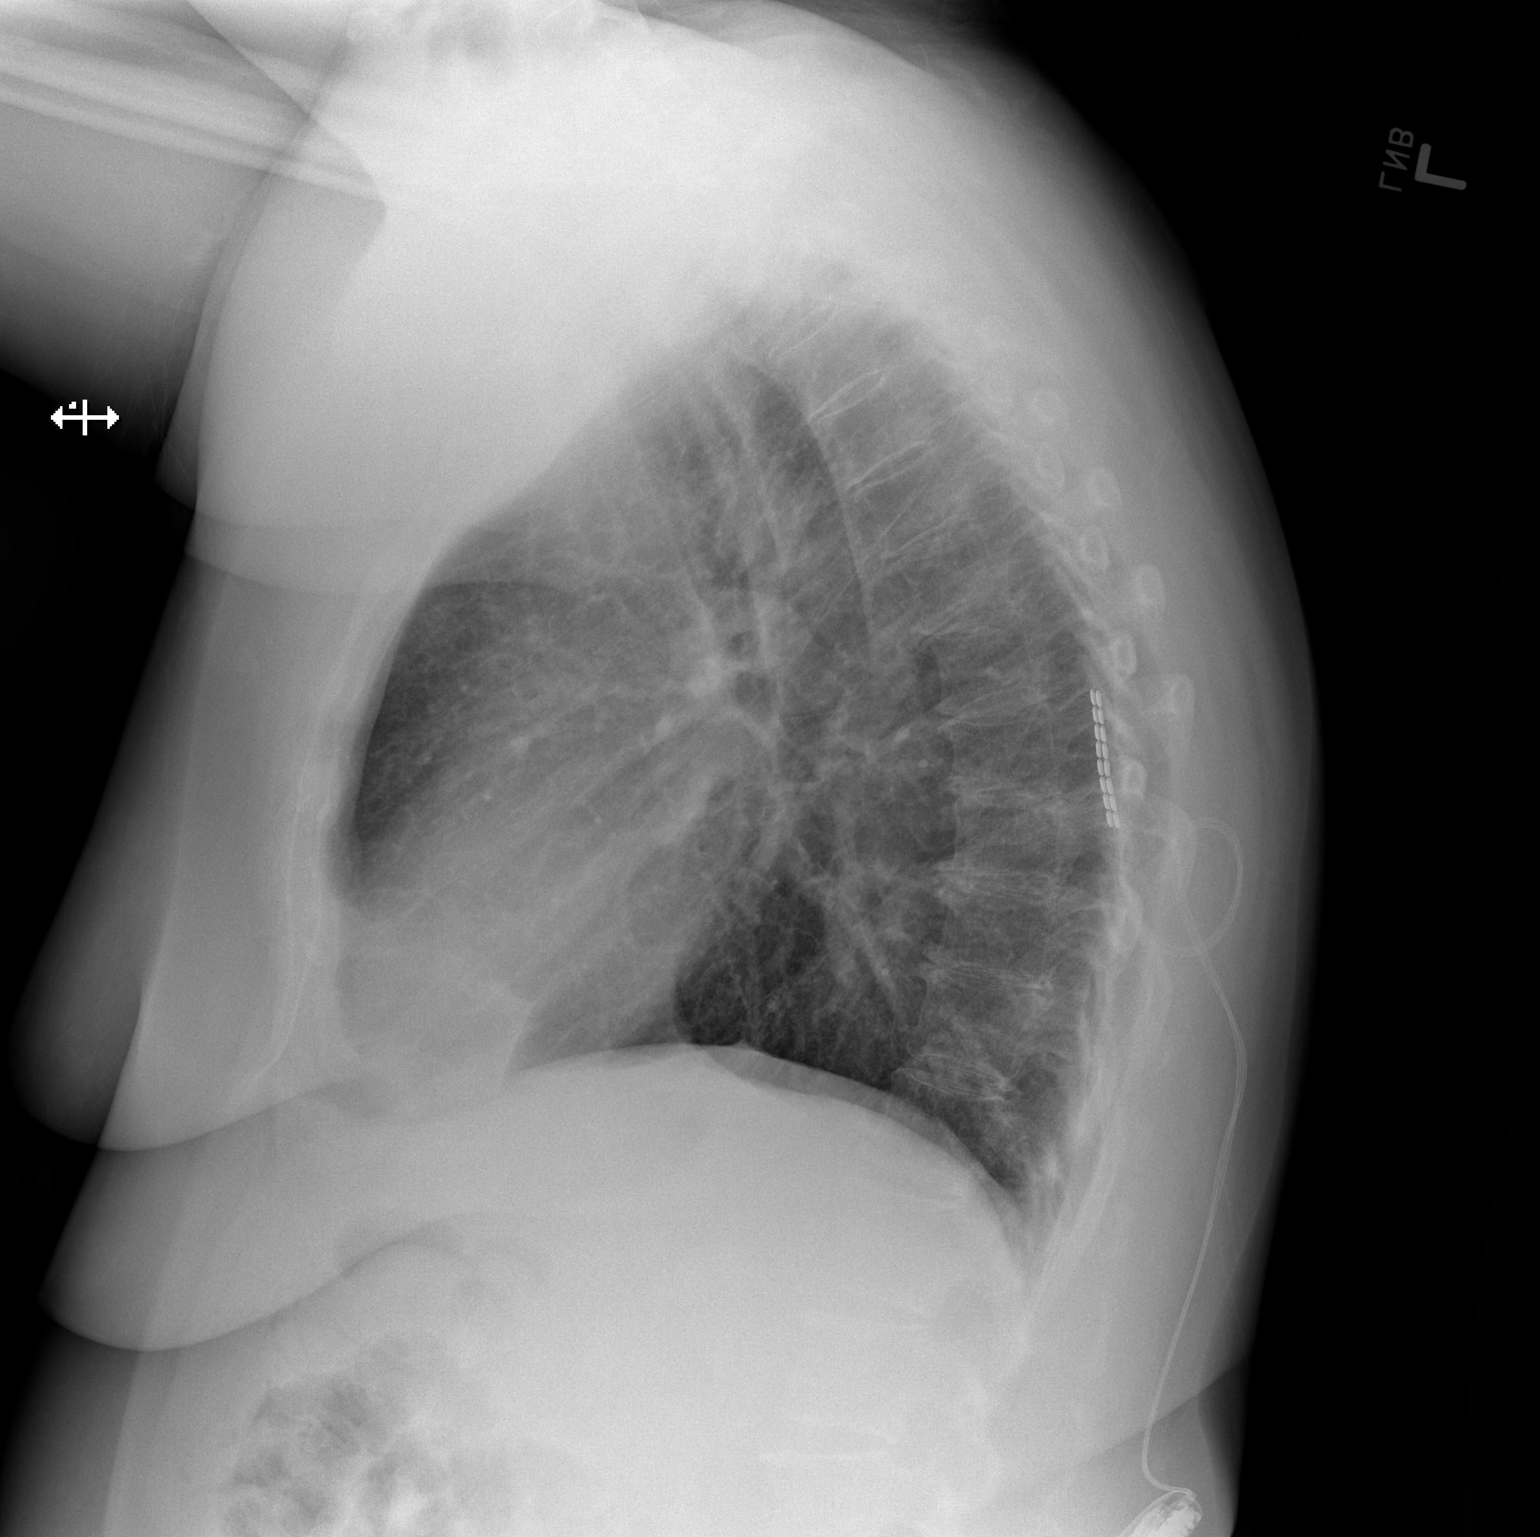

[2 of 2 positions shown; findings below may reference images not displayed]

PROCEDURE:     DXR - DXR CHEST PA (OR AP) AND LATERAL  - March 16, 2013  [DATE]

RESULT:     Comparison is made to the study of 07/27/2011. There are leads in
the spinal canal in the mid thoracic region with the superior margins at
approximately the T8 level. The lungs are clear. The heart and pulmonary
vessels are normal. The bony and mediastinal structures are unremarkable.
There is no effusion. There is no pneumothorax or evidence of congestive
failure.
IMPRESSION: No acute cardiopulmonary disease.

[REDACTED]

## 2015-05-16 ENCOUNTER — Encounter: Payer: Self-pay | Admitting: Internal Medicine

## 2016-03-20 ENCOUNTER — Encounter: Payer: Self-pay | Admitting: Internal Medicine

## 2017-08-21 ENCOUNTER — Other Ambulatory Visit: Payer: Self-pay | Admitting: General Surgery

## 2017-08-21 DIAGNOSIS — Z171 Estrogen receptor negative status [ER-]: Principal | ICD-10-CM

## 2017-08-21 DIAGNOSIS — C50412 Malignant neoplasm of upper-outer quadrant of left female breast: Secondary | ICD-10-CM

## 2022-11-09 DEATH — deceased
